# Patient Record
Sex: Female | Born: 1965
Health system: Southern US, Community
[De-identification: ages and names within clinical notes are randomized; demographics above are authoritative.]

## PROBLEM LIST (undated history)

## (undated) DIAGNOSIS — E785 Hyperlipidemia, unspecified: Secondary | ICD-10-CM

## (undated) DIAGNOSIS — K219 Gastro-esophageal reflux disease without esophagitis: Secondary | ICD-10-CM

## (undated) DIAGNOSIS — M1990 Unspecified osteoarthritis, unspecified site: Secondary | ICD-10-CM

## (undated) DIAGNOSIS — R51 Headache: Secondary | ICD-10-CM

## (undated) DIAGNOSIS — E559 Vitamin D deficiency, unspecified: Secondary | ICD-10-CM

## (undated) HISTORY — DX: Hyperlipidemia, unspecified: E78.5

## (undated) HISTORY — DX: Vitamin D deficiency, unspecified: E55.9

## (undated) HISTORY — DX: Unspecified osteoarthritis, unspecified site: M19.90

## (undated) HISTORY — PX: TONSILLECTOMY: SUR1361

---

## 1997-05-30 ENCOUNTER — Emergency Department (HOSPITAL_COMMUNITY): Admission: EM | Admit: 1997-05-30 | Discharge: 1997-05-30 | Payer: Self-pay | Admitting: Emergency Medicine

## 1998-01-05 ENCOUNTER — Emergency Department (HOSPITAL_COMMUNITY): Admission: EM | Admit: 1998-01-05 | Discharge: 1998-01-05 | Payer: Self-pay | Admitting: Emergency Medicine

## 1998-01-05 ENCOUNTER — Encounter: Payer: Self-pay | Admitting: Emergency Medicine

## 1998-07-12 ENCOUNTER — Encounter: Payer: Self-pay | Admitting: Emergency Medicine

## 1998-07-12 ENCOUNTER — Emergency Department (HOSPITAL_COMMUNITY): Admission: EM | Admit: 1998-07-12 | Discharge: 1998-07-12 | Payer: Self-pay | Admitting: Emergency Medicine

## 2000-05-08 ENCOUNTER — Encounter: Payer: Self-pay | Admitting: Internal Medicine

## 2000-05-08 ENCOUNTER — Ambulatory Visit (HOSPITAL_COMMUNITY): Admission: RE | Admit: 2000-05-08 | Discharge: 2000-05-08 | Payer: Self-pay | Admitting: Internal Medicine

## 2002-08-19 ENCOUNTER — Other Ambulatory Visit: Admission: RE | Admit: 2002-08-19 | Discharge: 2002-08-19 | Payer: Self-pay | Admitting: Obstetrics and Gynecology

## 2003-10-17 ENCOUNTER — Other Ambulatory Visit: Admission: RE | Admit: 2003-10-17 | Discharge: 2003-10-17 | Payer: Self-pay | Admitting: Obstetrics and Gynecology

## 2004-03-31 ENCOUNTER — Inpatient Hospital Stay (HOSPITAL_COMMUNITY): Admission: AD | Admit: 2004-03-31 | Discharge: 2004-04-02 | Payer: Self-pay | Admitting: Obstetrics and Gynecology

## 2005-04-10 ENCOUNTER — Other Ambulatory Visit: Admission: RE | Admit: 2005-04-10 | Discharge: 2005-04-10 | Payer: Self-pay | Admitting: Obstetrics and Gynecology

## 2006-01-13 ENCOUNTER — Encounter: Admission: RE | Admit: 2006-01-13 | Discharge: 2006-01-13 | Payer: Self-pay | Admitting: Internal Medicine

## 2007-02-24 ENCOUNTER — Encounter: Admission: RE | Admit: 2007-02-24 | Discharge: 2007-02-24 | Payer: Self-pay | Admitting: Obstetrics and Gynecology

## 2010-07-05 NOTE — Discharge Summary (Signed)
NAME:  TATE, Tomeshia                  ACCOUNT NO.:  [id number]   MEDICAL RECORD NO.:  [id number]          PATIENT TYPE:  INP   LOCATION:  9110                          FACILITY:  [facility]   PHYSICIAN:  Malachi Pro. Ambrose Mantle, M.D. DATE OF BIRTH:  Dec 15, 1965   DATE OF ADMISSION:  03/31/2004  DATE OF DISCHARGE:                                 DISCHARGE SUMMARY   HOSPITAL COURSE:  A 45 year old black female para 1-0-1-1 gravida 3; [facility]  April 23, 2004 by ultrasound; admitted with ruptured membranes in active  labor.  Blood group and type B positive, negative antibody, sickle cell  negative, RPR nonreactive, rubella immune, hepatitis B surface antigen  negative, HIV declined, GC and chlamydia negative, triple screen negative, 1-  hour Glucola 117, group B strep negative.  Ultrasound on September 27, 2003:  Gestational age [redacted] weeks 1 day; [facility] April 23, 2004.  Small fibroid was noted.  On November 22, 2003 ultrasound showed a mean gestational age of [redacted] weeks 2  days; [facility] April 22, 2004; small fibroid again noted.  The patient declined  amniocentesis.  On February 23, 2004 ultrasound was ordered for size greater  than dates.  According to the patient, ultrasound showed no evidence of  macrosomia.  At approximately 2:40 a.m. on the day of admission, the patient  had spontaneous rupture of membranes.  She began contracting en route to the  hospital.  On arrival here her cervix was 4 cm, 100%, vertex at a 0 station.  Past medical history:  No known allergies.  Operations:  In 1987, T&A.  Illnesses:  Urinary tract infections.  Family history:  Mother with high  blood pressure and diabetes, maternal grandmother with diabetes, maternal  grandfather with emphysema.  Alcohol, tobacco, and drugs:  The patient  smoked one cigarette per day.  Obstetric history:  In 1985, a spontaneous  abortion.  In 1992, a 5-pound 10-ounce female delivered vaginally.  Physical  exam revealed normal vital signs.  Heart normal size and sounds, no  murmurs.  Lungs clear to auscultation.  Abdomen was soft, near term-size fundus.  Fetal heart tones were normal.  Contractions every 3 minutes.  Cervix 6 cm,  100% by the admitting nurse.  The patient received Stadol and contractions  spaced out.  She made no progress so Pitocin was begun at 0.5 mU per minute  and contractions resumed.  She became fully dilated and it was noted after  being placed in stirrups that she had no anterior rectal sphincter (the  patient states she has occasional fecal incontinence but it is not a problem  for her).  The patient pushed well and the vertex was ROP.  She delivered  spontaneously ROP over an intact perineum by Dr. Ambrose Mantle a living female  infant, 5 pounds 5 ounces, Apgars of 9 at one and 9 at five minutes.  Placenta was intact, uterus normal, but I could not reach the fundus.  Rectal was negative.  No lacerations were present.  Absence of anterior  rectal sphincter was confirmed by rectal exam but there were no rectal  lacerations.  Blood loss was 200 mL.  Postpartum the patient did well and  was discharged on postpartum day #2.   LABORATORY DATA:  Initial hemoglobin 10.9; hematocrit 33.2; white count  7900; platelet count 269,000.  Follow-up hemoglobin 9.7.  RPR nonreactive.   FINAL DIAGNOSES:  1.  Intrauterine pregnancy at 36 weeks 5 days delivered right occiput      posterior.  2.  Premature labor and delivery.  3.  Absence of rectal sphincter anteriorly.   OPERATIONS:  Spontaneous delivery ROP.   FINAL CONDITION:  Improved.   Instructions include our regular discharge instruction booklet, Percocet  5/325 #24 tablets one q.4-6h. as needed for pain, and the patient is advised  to return in 6 weeks for follow-up examination.      TFH/MEDQ  D:  04/02/2004  T:  04/02/2004  Job:  [id number]

## 2010-10-28 ENCOUNTER — Other Ambulatory Visit: Payer: Self-pay | Admitting: Obstetrics and Gynecology

## 2010-10-28 DIAGNOSIS — Z1231 Encounter for screening mammogram for malignant neoplasm of breast: Secondary | ICD-10-CM

## 2010-11-08 ENCOUNTER — Ambulatory Visit
Admission: RE | Admit: 2010-11-08 | Discharge: 2010-11-08 | Disposition: A | Discharge status: Home / Self Care | Payer: 59 | Source: Ambulatory Visit | Attending: Obstetrics and Gynecology | Admitting: Obstetrics and Gynecology

## 2010-11-08 DIAGNOSIS — Z1231 Encounter for screening mammogram for malignant neoplasm of breast: Secondary | ICD-10-CM

## 2010-11-15 ENCOUNTER — Other Ambulatory Visit: Payer: Self-pay | Admitting: Obstetrics and Gynecology

## 2010-11-15 DIAGNOSIS — R928 Other abnormal and inconclusive findings on diagnostic imaging of breast: Secondary | ICD-10-CM

## 2010-11-27 ENCOUNTER — Ambulatory Visit
Admission: RE | Admit: 2010-11-27 | Discharge: 2010-11-27 | Disposition: A | Discharge status: Home / Self Care | Payer: 59 | Source: Ambulatory Visit | Attending: Obstetrics and Gynecology | Admitting: Obstetrics and Gynecology

## 2010-11-27 DIAGNOSIS — R928 Other abnormal and inconclusive findings on diagnostic imaging of breast: Secondary | ICD-10-CM

## 2011-08-13 DIAGNOSIS — L910 Hypertrophic scar: Secondary | ICD-10-CM | POA: Insufficient documentation

## 2011-08-13 DIAGNOSIS — B079 Viral wart, unspecified: Secondary | ICD-10-CM | POA: Insufficient documentation

## 2011-10-24 ENCOUNTER — Emergency Department (INDEPENDENT_AMBULATORY_CARE_PROVIDER_SITE_OTHER)
Admission: EM | Admit: 2011-10-24 | Discharge: 2011-10-24 | Disposition: A | Discharge status: Nursing Home (Includes ICF) | Payer: 59 | Source: Home / Self Care | Attending: Emergency Medicine | Admitting: Emergency Medicine

## 2011-10-24 ENCOUNTER — Encounter (HOSPITAL_COMMUNITY): Payer: Self-pay | Admitting: Emergency Medicine

## 2011-10-24 ENCOUNTER — Encounter (HOSPITAL_COMMUNITY): Payer: Self-pay | Admitting: *Deleted

## 2011-10-24 DIAGNOSIS — Z79899 Other long term (current) drug therapy: Secondary | ICD-10-CM | POA: Insufficient documentation

## 2011-10-24 DIAGNOSIS — F17200 Nicotine dependence, unspecified, uncomplicated: Secondary | ICD-10-CM | POA: Insufficient documentation

## 2011-10-24 DIAGNOSIS — R110 Nausea: Secondary | ICD-10-CM

## 2011-10-24 DIAGNOSIS — R109 Unspecified abdominal pain: Secondary | ICD-10-CM | POA: Insufficient documentation

## 2011-10-24 DIAGNOSIS — R1032 Left lower quadrant pain: Secondary | ICD-10-CM

## 2011-10-24 LAB — URINALYSIS, ROUTINE W REFLEX MICROSCOPIC
Leukocytes, UA: NEGATIVE
Nitrite: NEGATIVE
Specific Gravity, Urine: 1.033 — ABNORMAL HIGH (ref 1.005–1.030)
pH: 5.5 (ref 5.0–8.0)

## 2011-10-24 LAB — CBC
Hemoglobin: 11.4 g/dL — ABNORMAL LOW (ref 12.0–15.0)
MCH: 26.9 pg (ref 26.0–34.0)
MCHC: 32.8 g/dL (ref 30.0–36.0)
RDW: 14.4 % (ref 11.5–15.5)

## 2011-10-24 MED ORDER — ONDANSETRON 4 MG PO TBDP
ORAL_TABLET | ORAL | Status: AC
  Filled 2011-10-24: qty 2

## 2011-10-24 MED ORDER — ONDANSETRON 4 MG PO TBDP
8.0000 mg | ORAL_TABLET | Freq: Once | ORAL | Status: AC
  Administered 2011-10-24: 8 mg via ORAL

## 2011-10-24 NOTE — ED Notes (Signed)
Lab called to draw pts blood

## 2011-10-24 NOTE — ED Provider Notes (Signed)
History     CSN: [id number]  Arrival date & time 10/24/11  1929   First MD Initiated Contact with Patient 10/24/11 2108      Chief Complaint  Patient presents with  . Abdominal Pain    (Consider location/radiation/quality/duration/timing/severity/associated sxs/prior treatment) The history is provided by the patient.   Patient complains of LLQ pain that began 5 days ago,  pain worse after eating. Sunday, reports to have had fever, nausea, vomiting, diarrhea and cramping. Symptoms improved throughout the course of the week but still experiencing LLQ pain with nausea and abdominal cramping.     History reviewed. No pertinent past medical history.  Past Surgical History  Procedure Date  . Tonsillectomy     Family History  Problem Relation Age of Onset  . Diabetes Mother     History  Substance Use Topics  . Smoking status: Current Everyday Smoker    Types: Cigarettes  . Smokeless tobacco: Not on file  . Alcohol Use: No    OB History    Grav Para Term Preterm Abortions TAB SAB Ect Mult Living                  Review of Systems  Constitutional: Positive for chills and fatigue. Negative for fever, diaphoresis, activity change, appetite change and unexpected weight change.  Respiratory: Negative.   Cardiovascular: Negative.   Gastrointestinal: Positive for nausea, vomiting, abdominal pain, diarrhea and abdominal distention. Negative for blood in stool and anal bleeding.  Genitourinary: Negative.   Musculoskeletal: Negative.     Allergies  Review of patient's allergies indicates no known allergies.  Home Medications  No current outpatient prescriptions on file.  BP 125/80  Pulse 76  Temp 97.7 F (36.5 C) (Oral)  Resp 20  SpO2 99%  Physical Exam  Nursing note and vitals reviewed. Constitutional: She is oriented to person, place, and time. Vital signs are normal. She appears well-developed and well-nourished. She is active and cooperative.  HENT:  Head:  Normocephalic.  Eyes: Conjunctivae are normal. Pupils are equal, round, and reactive to light. No scleral icterus.  Neck: Trachea normal. Neck supple.  Cardiovascular: Normal rate, regular rhythm, normal heart sounds, intact distal pulses and normal pulses.   Pulmonary/Chest: Effort normal and breath sounds normal.  Abdominal: Soft. Normal appearance. She exhibits distension. She exhibits no shifting dullness, no abdominal bruit, no ascites and no mass. Bowel sounds are decreased. There is no hepatosplenomegaly. There is tenderness in the left lower quadrant. There is guarding.  Neurological: She is alert and oriented to person, place, and time. No cranial nerve deficit or sensory deficit.  Skin: Skin is warm and dry.  Psychiatric: She has a normal mood and affect. Her speech is normal and behavior is normal. Judgment and thought content normal. Cognition and memory are normal.    ED Course  Procedures (including critical care time)  Labs Reviewed - No data to display No results found.   1. LLQ abdominal pain   2. Nausea       MDM  Zofran 8mg  ODT prior to departure.  Transfer to [facility] for further evaluation and treatment of LLQ pain and nausea.        Johnsie Kindred, NP 10/24/11 2205

## 2011-10-24 NOTE — ED Notes (Signed)
5 days of LLQ pain that is constant and dull,with episodes of cramping at times.  She also had N and V with fever  4 days ago.   Her last BM was small and hard with a streak of blood noted by her.  Her appetite has been decreased

## 2011-10-24 NOTE — ED Notes (Signed)
C/o LLQ pain and nausea since Sunday.  States she did have vomiting, diarrhea, and fever that have resolved.  Pt sent from [facility].

## 2011-10-24 NOTE — ED Provider Notes (Signed)
Medical screening examination/treatment/procedure(s) were performed by non-physician practitioner and as supervising physician I was immediately available for consultation/collaboration. I personally discussed this case with Porfirio Mylar. This patient does have a risk for diverticulitis, and therefore I concur with her decision to transfer to the emergency department for further evaluation and treatment.  Leslee Home, M.D.   Reuben Likes, MD 10/24/11 [phone]

## 2011-10-25 ENCOUNTER — Emergency Department (HOSPITAL_COMMUNITY): Payer: 59

## 2011-10-25 ENCOUNTER — Emergency Department (HOSPITAL_COMMUNITY)
Admission: EM | Admit: 2011-10-25 | Discharge: 2011-10-25 | Disposition: A | Discharge status: Home / Self Care | Payer: 59 | Attending: Emergency Medicine | Admitting: Emergency Medicine

## 2011-10-25 DIAGNOSIS — R109 Unspecified abdominal pain: Secondary | ICD-10-CM

## 2011-10-25 LAB — COMPREHENSIVE METABOLIC PANEL
ALT: 13 U/L (ref 0–35)
Albumin: 3.5 g/dL (ref 3.5–5.2)
BUN: 9 mg/dL (ref 6–23)
Calcium: 9.4 mg/dL (ref 8.4–10.5)
GFR calc Af Amer: >90 mL/min (ref >90)
Glucose, Bld: 128 mg/dL — ABNORMAL HIGH (ref 70–99)
Sodium: 140 mEq/L (ref 135–145)
Total Protein: 7.2 g/dL (ref 6.0–8.3)

## 2011-10-25 MED ORDER — SODIUM CHLORIDE 0.9 % IV SOLN
INTRAVENOUS | Status: DC
  Administered 2011-10-25: 02:00:00 via INTRAVENOUS

## 2011-10-25 MED ORDER — ONDANSETRON HCL 4 MG PO TABS: 4.0000 mg | ORAL_TABLET | Freq: Three times a day (TID) | ORAL | Status: AC | PRN

## 2011-10-25 MED ORDER — HYDROCODONE-ACETAMINOPHEN 5-325 MG PO TABS: 1.0000 | ORAL_TABLET | ORAL | Status: AC | PRN

## 2011-10-25 MED ORDER — ONDANSETRON HCL 4 MG/2ML IJ SOLN
4.0000 mg | Freq: Once | INTRAMUSCULAR | Status: AC
  Administered 2011-10-25: 4 mg via INTRAVENOUS
  Filled 2011-10-25: qty 2

## 2011-10-25 MED ORDER — IOHEXOL 300 MG/ML  SOLN
100.0000 mL | Freq: Once | INTRAMUSCULAR | Status: AC | PRN
  Administered 2011-10-25: 100 mL via INTRAVENOUS

## 2011-10-25 MED ORDER — IOHEXOL 300 MG/ML  SOLN
20.0000 mL | INTRAMUSCULAR | Status: AC
  Administered 2011-10-25: 20 mL via ORAL

## 2011-10-25 NOTE — ED Provider Notes (Signed)
History     CSN: [id number]  Arrival date & time 10/24/11  2219   First MD Initiated Contact with Patient 10/25/11 0150      Chief Complaint  Patient presents with  . Abdominal Pain    (Consider location/radiation/quality/duration/timing/severity/associated sxs/prior treatment) HPI Comments: Jacqueline Baxter is a 46 y.o. Female   who has had left lower quadrant abdominal pain for 5 days. She initially had diarrhea, but has had none in the last 2 days. Since then, her stools are smaller caliber than usual, and she occasionally sees blood mixed in. She has ongoing, nausea, without vomiting. She was able to eat lunch today. She's never had this previously. There are no known aggravating or palliative factors. She has a pressure sensation when voiding, which is uncomfortable. She denies dysuria, or urinary frequency. She has no vaginal discharge, or bleeding; she does not know when her last menstrual period was.   Patient is a 46 y.o. female presenting with abdominal pain. The history is provided by the patient.  Abdominal Pain The primary symptoms of the illness include abdominal pain.    History reviewed. No pertinent past medical history.  Past Surgical History  Procedure Date  . Tonsillectomy     Family History  Problem Relation Age of Onset  . Diabetes Mother     History  Substance Use Topics  . Smoking status: Current Everyday Smoker    Types: Cigarettes  . Smokeless tobacco: Not on file  . Alcohol Use: No    OB History    Grav Para Term Preterm Abortions TAB SAB Ect Mult Living                  Review of Systems  Gastrointestinal: Positive for abdominal pain.  All other systems reviewed and are negative.    Allergies  Penicillins  Home Medications   Current Outpatient Rx  Name Route Sig Dispense Refill  . ADULT MULTIVITAMIN W/MINERALS CH Oral Take 1 tablet by mouth daily.    Marland Kitchen RASPBERRY KETONES PO Oral Take 1 tablet by mouth every morning.    Marland Kitchen VITAMIN C  500 MG PO TABS Oral Take 500 mg by mouth daily.    Marland Kitchen HYDROCODONE-ACETAMINOPHEN 5-325 MG PO TABS Oral Take 1 tablet by mouth every 4 (four) hours as needed for pain. 20 tablet 0  . ONDANSETRON HCL 4 MG PO TABS Oral Take 1 tablet (4 mg total) by mouth every 8 (eight) hours as needed for nausea. 12 tablet 0    BP 107/77  Pulse 64  Temp 96.8 F (36 C) (Oral)  Resp 14  SpO2 98%  Physical Exam  Nursing note and vitals reviewed. Constitutional: She is oriented to person, place, and time. She appears well-developed and well-nourished. No distress.  HENT:  Head: Normocephalic and atraumatic.  Eyes: Conjunctivae and EOM are normal. Pupils are equal, round, and reactive to light.  Neck: Normal range of motion and phonation normal. Neck supple.  Cardiovascular: Normal rate, regular rhythm and intact distal pulses.   Pulmonary/Chest: Effort normal and breath sounds normal. She exhibits no tenderness.  Abdominal: Soft. She exhibits no distension and no mass. There is tenderness (Mild left lower quadrant). There is no rebound and no guarding.        No suprapubic tenderness  Musculoskeletal: Normal range of motion.  Neurological: She is alert and oriented to person, place, and time. She has normal strength. She exhibits normal muscle tone.  Skin: Skin is warm and dry.  Psychiatric: She has a normal mood and affect. Her behavior is normal. Judgment and thought content normal.    ED Course  Procedures (including critical care time)  Emergency department treatment: IV fluids, and IV Zofran. Reevaluation: 07:20- patient is comfortable now. No vomiting. After oral contrast Repeat vital signs are reassuring.  Case discussed with on-call GI. He recommends, stool cultures, stool, when necessary followup with him for reevaluation next week     Labs Reviewed  CBC - Abnormal; Notable for the following:    Hemoglobin 11.4 (*)     HCT 34.8 (*)     All other components within normal limits    COMPREHENSIVE METABOLIC PANEL - Abnormal; Notable for the following:    Glucose, Bld 128 (*)     Total Bilirubin 0.1 (*)     All other components within normal limits  URINALYSIS, ROUTINE W REFLEX MICROSCOPIC - Abnormal; Notable for the following:    APPearance CLOUDY (*)     Specific Gravity, Urine 1.033 (*)     All other components within normal limits  POCT PREGNANCY, URINE  GI PATHOGEN PANEL BY PCR, STOOL  STOOL CULTURE   Ct Abdomen Pelvis W Contrast  10/25/2011  **ADDENDUM** CREATED: 10/25/2011 06:53:15  Examination results discussed with Dr. Effie Shy at 0981 hours on 10/25/2011.  Appendix is not specifically identified.  **END ADDENDUM** SIGNED BY: Marlon Pel, M.D.   10/25/2011  *RADIOLOGY REPORT*  Clinical Data: Left lower quadrant pain worse after eating.  Fever. Nausea.  Vomiting.  Diarrhea.  Cramping.  CT ABDOMEN AND PELVIS WITH CONTRAST  Technique:  Multidetector CT imaging of the abdomen and pelvis was performed following the standard protocol during bolus administration of intravenous contrast.  Contrast: OMNIPAQUE IOHEXOL 300 MG/ML  SOLN  Comparison: None.  Findings: Mild dependent atelectasis in the lung bases.  Diffuse low attenuation change in the liver consistent with fatty infiltration.  The spleen, gallbladder, pancreas, adrenal glands, kidneys, abdominal aorta, and retroperitoneal lymph nodes are unremarkable.  No free fluid or free air in the abdomen.  The stomach and small bowel are decompressed.  The colon is diffusely stool filled.  The colon is incompletely distended but there is a suggestion of thickening of the wall of the right colon and transverse colon with some thickening also demonstrated in the terminal ileum.  There is mild inflammatory stranding in the right lower quadrant and around the sigmoid colon.  Changes suggest colitis.  This could be due to infectious colitis or inflammatory bowel disease such as Crohn's. Coexisting diverticulitis not excluded.  The  portal and mesenteric vessels appear patent.  Pelvis:  Bladder wall is not thickened.  Intrauterine device in place.  Uterus and adnexal structures are not enlarged. Diverticulosis of sigmoid colon.  No free or loculated pelvic fluid collections.  No significant pelvic lymphadenopathy.  Normal alignment of the lumbar vertebrae.  IMPRESSION: Wall thickening involving the terminal ileum and right colon with inflammatory stranding in the right lower quadrant and around the sigmoid colon.  Changes suggest either or a combination of infectious/inflammatory colitis and / or diverticulitis.   Original Report Authenticated By: Marlon Pel, M.D.      1. Abdominal  pain, other specified site       MDM  Nonspecific pain, with stooling, abnormality, likely, a GI process. Clinically doubt pelvic infection or disorder. Evaluation with CT scan for possibilities in including colitis.     Plan: Home Medications- Norco, Zofran; Home Treatments- low fiber diet; Recommended  follow up- GI in 4 days    Flint Melter, MD 10/25/11 [phone]

## 2011-10-25 NOTE — ED Notes (Signed)
Pt unable to give stool sample at the time. Spoke with lab, pt to give outpatient stool sample and return to main lab for testing. Instructions explained to patient, no further questions at the time.

## 2011-10-29 ENCOUNTER — Encounter: Payer: Self-pay | Admitting: Gastroenterology

## 2011-11-11 ENCOUNTER — Other Ambulatory Visit: Payer: Self-pay | Admitting: Gastroenterology

## 2011-11-11 DIAGNOSIS — R109 Unspecified abdominal pain: Secondary | ICD-10-CM

## 2011-11-11 DIAGNOSIS — R112 Nausea with vomiting, unspecified: Secondary | ICD-10-CM

## 2011-11-26 ENCOUNTER — Ambulatory Visit: Payer: 59 | Admitting: Gastroenterology

## 2011-11-26 ENCOUNTER — Telehealth: Payer: Self-pay | Admitting: Gastroenterology

## 2011-11-26 NOTE — Telephone Encounter (Signed)
Message copied by Leanor Kail I on Wed Nov 26, 2011 11:28 AM ------      Message from: Donata Duff      Created: Wed Nov 26, 2011 11:24 AM       Do not bill

## 2011-11-27 ENCOUNTER — Encounter (HOSPITAL_COMMUNITY)
Admission: RE | Admit: 2011-11-27 | Discharge: 2011-11-27 | Disposition: A | Discharge status: Home / Self Care | Payer: 59 | Source: Ambulatory Visit | Attending: Gastroenterology | Admitting: Gastroenterology

## 2011-11-27 DIAGNOSIS — K8020 Calculus of gallbladder without cholecystitis without obstruction: Secondary | ICD-10-CM | POA: Insufficient documentation

## 2011-11-27 DIAGNOSIS — R109 Unspecified abdominal pain: Secondary | ICD-10-CM

## 2011-11-27 DIAGNOSIS — R112 Nausea with vomiting, unspecified: Secondary | ICD-10-CM | POA: Insufficient documentation

## 2012-04-26 LAB — HM COLONOSCOPY

## 2012-04-30 ENCOUNTER — Emergency Department (HOSPITAL_COMMUNITY)
Admission: EM | Admit: 2012-04-30 | Discharge: 2012-04-30 | Disposition: A | Discharge status: Home / Self Care | Payer: 59 | Source: Home / Self Care

## 2012-04-30 ENCOUNTER — Encounter (HOSPITAL_COMMUNITY): Payer: Self-pay | Admitting: Emergency Medicine

## 2012-04-30 ENCOUNTER — Emergency Department (INDEPENDENT_AMBULATORY_CARE_PROVIDER_SITE_OTHER): Payer: 59

## 2012-04-30 DIAGNOSIS — S63509A Unspecified sprain of unspecified wrist, initial encounter: Secondary | ICD-10-CM

## 2012-04-30 DIAGNOSIS — S63502A Unspecified sprain of left wrist, initial encounter: Secondary | ICD-10-CM

## 2012-04-30 HISTORY — DX: Gastro-esophageal reflux disease without esophagitis: K21.9

## 2012-04-30 MED ORDER — NAPROXEN 375 MG PO TABS: 375.0000 mg | ORAL_TABLET | Freq: Two times a day (BID) | ORAL | Status: DC

## 2012-04-30 NOTE — ED Provider Notes (Signed)
History     CSN: [id number]  Arrival date & time 04/30/12  1425   First MD Initiated Contact with Patient 04/30/12 1559      Chief Complaint  Patient presents with  . Wrist Pain    (Consider location/radiation/quality/duration/timing/severity/associated sxs/prior treatment) HPI 47 y.o. female with left wrist injury.  Hit Left wrist on door yesterday AM - caught arm in door as closed. Hurt but did not notice any major problems at time. But pain has been increasing since. Today hurt all day and hard to sleep last night. Getting worse. Burning pain on ulnar side of wrist, palmar surface. Aches/throbs when holds hand down or supinates.  Feels like fingers are swollen today. Most movements of wrist and fingers cause pain - making fist, flexing wrist, flexing and extending fingers. Has tried ice and tylenol. Helps some. Wrapped with ACE bandage but made it hurt more. Hurts to touch it. Sensation intact in fingers. Works at [employer] - not able to tpe all day.   Past Medical History  Diagnosis Date  . Acid reflux     Past Surgical History  Procedure Laterality Date  . Tonsillectomy      Family History  Problem Relation Age of Onset  . Diabetes Mother     History  Substance Use Topics  . Smoking status: Current Every Day Smoker    Types: Cigarettes  . Smokeless tobacco: Not on file  . Alcohol Use: No    OB History   Grav Para Term Preterm Abortions TAB SAB Ect Mult Living                  Review of Systems  Constitutional: Negative for fever and chills.  Neurological: Negative for weakness and numbness.    Allergies  Penicillins  Home Medications   Current Outpatient Rx  Name  Route  Sig  Dispense  Refill  . Omeprazole-Sodium Bicarbonate (ZEGERID PO)   Oral   Take by mouth.         . Multiple Vitamin (MULTIVITAMIN WITH MINERALS) TABS   Oral   Take 1 tablet by mouth daily.         Marland Kitchen RASPBERRY KETONES PO   Oral   Take 1 tablet by mouth every morning.          . vitamin C (ASCORBIC ACID) 500 MG tablet   Oral   Take 500 mg by mouth daily.           BP 149/80  Pulse 65  Temp(Src) 98.3 F (36.8 C) (Oral)  Resp 18  SpO2 100%  LMP 04/01/2012  Physical Exam  Constitutional: She appears well-developed and well-nourished. No distress.  HENT:  Head: Normocephalic and atraumatic.  Eyes: Conjunctivae and EOM are normal.  Cardiovascular: Normal rate.   Pulmonary/Chest: Effort normal. No respiratory distress.  Musculoskeletal:  L wrist - no visible bruising, mild swelling ulnar/palmar aspect > radial/dorsal. Tenderness at junction of carpal area and distal radius/ulna on palmar aspect. Able to move all fingers with pain. Pain with passive flexion of 4th and 5th digits especially and passive extension of thumb. No pain or swelling or elbow. Some tenderness with palpation over flexor tendons, esp midline and with flexion of wrist and extension of wrist. Radial and ulnar pulse intact.   Skin: Skin is warm and dry.  Psychiatric: She has a normal mood and affect.    ED Course  Procedures (including critical care time)  *RADIOLOGY REPORT*  Clinical Data: History of injury.  Pain in the medial aspect.  LEFT WRIST - COMPLETE 3+ VIEW  Comparison: None.  Findings: Alignment is normal. Joint spaces are preserved. No  fracture or dislocation is evident. No soft tissue lesions are  seen. Scaphoid appears intact.  IMPRESSION:  No wrist abnormality identified.  I personally reviewed images and report. No fractures noted.  Labs Reviewed - No data to display No results found.   1. Sprain of left wrist, initial encounter    Wrist splint provided Rest, ice, compression, elevation Naproxen. Follow up with PCP as needed.  Napoleon Form, MD       Napoleon Form, MD 04/30/12 2227

## 2012-04-30 NOTE — ED Notes (Signed)
Pt c/o wrist pain on right hand since yest Reports hitting a door as she was leaving; did not pay much attention thinking it would resolve on its own.  Pain graudually getting worse w/limitted ROM Sx include: swelling, pain that increases w/activity Took tyle around 0730 for the discomfort  She is alert and oriented w/no signs of acute distress

## 2012-06-29 ENCOUNTER — Ambulatory Visit (INDEPENDENT_AMBULATORY_CARE_PROVIDER_SITE_OTHER): Payer: Self-pay | Admitting: Surgery

## 2012-07-13 ENCOUNTER — Encounter (INDEPENDENT_AMBULATORY_CARE_PROVIDER_SITE_OTHER): Payer: Self-pay | Admitting: Surgery

## 2012-07-20 ENCOUNTER — Ambulatory Visit (INDEPENDENT_AMBULATORY_CARE_PROVIDER_SITE_OTHER): Payer: 59 | Admitting: Surgery

## 2012-07-20 ENCOUNTER — Encounter (INDEPENDENT_AMBULATORY_CARE_PROVIDER_SITE_OTHER): Payer: Self-pay | Admitting: Surgery

## 2012-07-20 VITALS — BP 130/78 | HR 86 | Temp 97.0°F | Resp 14 | Ht 61.0 in | Wt 177.0 lb

## 2012-07-20 DIAGNOSIS — K8020 Calculus of gallbladder without cholecystitis without obstruction: Secondary | ICD-10-CM

## 2012-07-20 HISTORY — DX: Calculus of gallbladder without cholecystitis without obstruction: K80.20

## 2012-07-20 NOTE — Progress Notes (Signed)
Patient ID: Jacqueline Baxter, female   DOB: 03/07/1965, 47 y.o.   MRN: [id number]  Chief Complaint  Patient presents with  . Other    gallstones    HPI Jacqueline Baxter is a 47 y.o. female.   HPI This is a very pleasant female referred to me by Dr. Loreta Ave for evaluation of symptomatic cholelithiasis. The patient has known gallstones diagnosed on ultrasound last year after an attack of right upper quadrant abdominal pain, nausea, and vomiting. She originally controlled her symptoms with diet modification but they are now increasing. She will have attacks of right upper quadrant and epigastric abdominal pain with nausea and vomiting after fatty meals. She is currently pain-free today. The pain is moderate and sharp when the attacks occur. She is otherwise without complaints other than mild loose bowel movements. Past Medical History  Diagnosis Date  . Acid reflux   . Vitamin D deficiency disease   . Hyperlipidemia   . Arthritis     Past Surgical History  Procedure Laterality Date  . Tonsillectomy      Family History  Problem Relation Age of Onset  . Diabetes Mother     Social History History  Substance Use Topics  . Smoking status: Current Every Day Smoker -- 0.25 packs/day    Types: Cigarettes  . Smokeless tobacco: Never Used  . Alcohol Use: Yes     Comment: rare    Allergies  Allergen Reactions  . Penicillins Other (See Comments)    Unknown childhood reaction    Current Outpatient Prescriptions  Medication Sig Dispense Refill  . butalbital-acetaminophen-caffeine (FIORICET, ESGIC) 50-325-40 MG per tablet       . Multiple Vitamin (MULTIVITAMIN WITH MINERALS) TABS Take 1 tablet by mouth daily.      Maxwell Caul Bicarbonate (ZEGERID PO) Take by mouth.      . Probiotic Product (PROBIOTIC PEARLS PO) Take by mouth.      . vitamin C (ASCORBIC ACID) 500 MG tablet Take 500 mg by mouth daily.      . naproxen (NAPROSYN) 375 MG tablet Take 1 tablet (375 mg total) by mouth 2  (two) times daily.  30 tablet  0  . RASPBERRY KETONES PO Take 1 tablet by mouth every morning.       No current facility-administered medications for this visit.    Review of Systems Review of Systems  Constitutional: Negative for fever, chills and unexpected weight change.  HENT: Negative for hearing loss, congestion, sore throat, trouble swallowing and voice change.   Eyes: Negative for visual disturbance.  Respiratory: Negative for cough and wheezing.   Cardiovascular: Negative for chest pain, palpitations and leg swelling.  Gastrointestinal: Positive for nausea, vomiting and abdominal pain. Negative for diarrhea, constipation, blood in stool, abdominal distention and anal bleeding.  Genitourinary: Negative for hematuria, vaginal bleeding and difficulty urinating.  Musculoskeletal: Positive for arthralgias.  Skin: Negative for rash and wound.  Neurological: Positive for headaches. Negative for seizures and syncope.  Hematological: Negative for adenopathy. Does not bruise/bleed easily.  Psychiatric/Behavioral: Negative for confusion.    Blood pressure 130/78, pulse 86, temperature 97 F (36.1 C), temperature source Temporal, resp. rate 14, height 5\' 1"  (1.549 m), weight 177 lb (80.287 kg), SpO2 98.00%.  Physical Exam Physical Exam  Constitutional: She is oriented to person, place, and time. She appears well-developed and well-nourished. No distress.  HENT:  Head: Normocephalic and atraumatic.  Right Ear: External ear normal.  Left Ear: External ear normal.  Nose: Nose  normal.  Mouth/Throat: Oropharynx is clear and moist. No oropharyngeal exudate.  Eyes: Conjunctivae are normal. Pupils are equal, round, and reactive to light. Right eye exhibits no discharge. Left eye exhibits no discharge. No scleral icterus.  Neck: Normal range of motion. Neck supple. No tracheal deviation present.  Cardiovascular: Normal rate, regular rhythm, normal heart sounds and intact distal pulses.   No  murmur heard. Pulmonary/Chest: Effort normal and breath sounds normal. No respiratory distress. She has no wheezes.  Abdominal: Soft. Bowel sounds are normal. She exhibits no distension. There is tenderness. There is no rebound.  There is very mild tenderness with minimal guarding in the right upper quadrant  Musculoskeletal: Normal range of motion. She exhibits no edema and no tenderness.  Neurological: She is alert and oriented to person, place, and time.  Skin: Skin is warm and dry. No rash noted. No erythema.  Psychiatric: Her behavior is normal. Judgment normal.    Data Reviewed I have been answered Dr. Kenna Gilbert office. I also have the ultrasound showing a 2.6 cm gallstone. I have the reports from the endoscopy as well  Assessment    Symptomatic cholelithiasis     Plan    Laparoscopic cholecystectomy with possible cholangiogram to strongly recommended given her symptoms. I discussed the surgical procedure in detail and gave her literature regarding the surgery. I discussed the risks which includes but is not limited to bleeding, infection, obdurate injury, bile leak, injury to other structures, postoperative recovery, need to convert to an open procedure, et Karie Soda. She understands and wishes to proceed. Surgery will be scheduled. Likelihood of success is good        Remiel Corti A 07/20/2012, 10:26 AM

## 2012-08-05 ENCOUNTER — Encounter (HOSPITAL_COMMUNITY): Payer: Self-pay | Admitting: Respiratory Therapy

## 2012-08-06 ENCOUNTER — Encounter (HOSPITAL_COMMUNITY): Payer: Self-pay

## 2012-08-06 ENCOUNTER — Encounter (HOSPITAL_COMMUNITY)
Admission: RE | Admit: 2012-08-06 | Discharge: 2012-08-06 | Disposition: A | Discharge status: Home / Self Care | Payer: 59 | Source: Ambulatory Visit | Attending: Surgery | Admitting: Surgery

## 2012-08-06 HISTORY — DX: Headache: R51

## 2012-08-06 LAB — COMPREHENSIVE METABOLIC PANEL
ALT: 12 U/L (ref 0–35)
AST: 14 U/L (ref 0–37)
Albumin: 3.6 g/dL (ref 3.5–5.2)
Alkaline Phosphatase: 50 U/L (ref 39–117)
CO2: 29 mEq/L (ref 19–32)
Chloride: 105 mEq/L (ref 96–112)
Creatinine, Ser: 0.78 mg/dL (ref 0.50–1.10)
Potassium: 4.1 mEq/L (ref 3.5–5.1)
Sodium: 141 mEq/L (ref 135–145)
Total Bilirubin: 0.3 mg/dL (ref 0.3–1.2)

## 2012-08-06 LAB — HCG, SERUM, QUALITATIVE: Preg, Serum: NEGATIVE

## 2012-08-06 LAB — CBC
MCH: 26.9 pg (ref 26.0–34.0)
MCHC: 32.5 g/dL (ref 30.0–36.0)
MCV: 82.8 fL (ref 78.0–100.0)
Platelets: 280 10*3/uL (ref 150–400)
RBC: 4.72 MIL/uL (ref 3.87–5.11)

## 2012-08-06 LAB — SURGICAL PCR SCREEN: Staphylococcus aureus: NEGATIVE

## 2012-08-06 NOTE — Pre-Procedure Instructions (Addendum)
Jacqueline Baxter  08/06/2012   Your procedure is scheduled on 08/12/12  Report to [facility] at100 PM.  Call this number if you have problems the morning of surgery: [phone]   Remember:   Do not eat food or drink liquids after midnight.   Take these medicines the morning of surgery with A SIP OF WATER: omeprazole   Do not wear jewelry, make-up or nail polish.  Do not wear lotions, powders, or perfumes. You may wear deodorant.  Do not shave 48 hours prior to surgery. Men may shave face and neck.  Do not bring valuables to the hospital.  [facility] is not responsible                   for any belongings or valuables.  Contacts, dentures or bridgework may not be worn into surgery.  Leave suitcase in the car. After surgery it may be brought to your room.  For patients admitted to the hospital, checkout time is 11:00 AM the day of  discharge.   Patients discharged the day of surgery will not be allowed to drive  home.  Name and phone number of your driver:  Special Instructions: Shower using CHG 2 nights before surgery and the night before surgery.  If you shower the day of surgery use CHG.  Use special wash - you have one bottle of CHG for all showers.  You should use approximately 1/3 of the bottle for each shower.   Please read over the following fact sheets that you were given: Pain Booklet, Coughing and Deep Breathing, MRSA Information and Surgical Site Infection Prevention

## 2012-08-09 ENCOUNTER — Encounter (INDEPENDENT_AMBULATORY_CARE_PROVIDER_SITE_OTHER): Payer: Self-pay

## 2012-08-11 MED ORDER — CIPROFLOXACIN IN D5W 400 MG/200ML IV SOLN
400.0000 mg | INTRAVENOUS | Status: AC
  Administered 2012-08-12: 400 mg via INTRAVENOUS
  Filled 2012-08-11: qty 200

## 2012-08-11 NOTE — H&P (Signed)
Patient ID: Jacqueline Baxter, female DOB: 03-06-1965, 47 y.o. MRN: [id number]  Chief Complaint   Patient presents with   .  Other     gallstones   HPI  Jacqueline Baxter is a 47 y.o. female.  HPI  This is a very pleasant female referred to me by Dr. Loreta Ave for evaluation of symptomatic cholelithiasis. The patient has known gallstones diagnosed on ultrasound last year after an attack of right upper quadrant abdominal pain, nausea, and vomiting. She originally controlled her symptoms with diet modification but they are now increasing. She will have attacks of right upper quadrant and epigastric abdominal pain with nausea and vomiting after fatty meals. She is currently pain-free today. The pain is moderate and sharp when the attacks occur. She is otherwise without complaints other than mild loose bowel movements.  Past Medical History   Diagnosis  Date   .  Acid reflux    .  Vitamin D deficiency disease    .  Hyperlipidemia    .  Arthritis     Past Surgical History   Procedure  Laterality  Date   .  Tonsillectomy      Family History   Problem  Relation  Age of Onset   .  Diabetes  Mother    Social History  History   Substance Use Topics   .  Smoking status:  Current Every Day Smoker -- 0.25 packs/day     Types:  Cigarettes   .  Smokeless tobacco:  Never Used   .  Alcohol Use:  Yes      Comment: rare    Allergies   Allergen  Reactions   .  Penicillins  Other (See Comments)     Unknown childhood reaction    Current Outpatient Prescriptions   Medication  Sig  Dispense  Refill   .  butalbital-acetaminophen-caffeine (FIORICET, ESGIC) 50-325-40 MG per tablet      .  Multiple Vitamin (MULTIVITAMIN WITH MINERALS) TABS  Take 1 tablet by mouth daily.     Maxwell Caul Bicarbonate (ZEGERID PO)  Take by mouth.     .  Probiotic Product (PROBIOTIC PEARLS PO)  Take by mouth.     .  vitamin C (ASCORBIC ACID) 500 MG tablet  Take 500 mg by mouth daily.     .  naproxen (NAPROSYN) 375 MG tablet   Take 1 tablet (375 mg total) by mouth 2 (two) times daily.  30 tablet  0   .  RASPBERRY KETONES PO  Take 1 tablet by mouth every morning.      No current facility-administered medications for this visit.   Review of Systems  Review of Systems  Constitutional: Negative for fever, chills and unexpected weight change.  HENT: Negative for hearing loss, congestion, sore throat, trouble swallowing and voice change.  Eyes: Negative for visual disturbance.  Respiratory: Negative for cough and wheezing.  Cardiovascular: Negative for chest pain, palpitations and leg swelling.  Gastrointestinal: Positive for nausea, vomiting and abdominal pain. Negative for diarrhea, constipation, blood in stool, abdominal distention and anal bleeding.  Genitourinary: Negative for hematuria, vaginal bleeding and difficulty urinating.  Musculoskeletal: Positive for arthralgias.  Skin: Negative for rash and wound.  Neurological: Positive for headaches. Negative for seizures and syncope.  Hematological: Negative for adenopathy. Does not bruise/bleed easily.  Psychiatric/Behavioral: Negative for confusion.  Blood pressure 130/78, pulse 86, temperature 97 F (36.1 C), temperature source Temporal, resp. rate 14, height 5\' 1"  (1.549 m), weight  177 lb (80.287 kg), SpO2 98.00%.  Physical Exam  Physical Exam  Constitutional: She is oriented to person, place, and time. She appears well-developed and well-nourished. No distress.  HENT:  Head: Normocephalic and atraumatic.  Right Ear: External ear normal.  Left Ear: External ear normal.  Nose: Nose normal.  Mouth/Throat: Oropharynx is clear and moist. No oropharyngeal exudate.  Eyes: Conjunctivae are normal. Pupils are equal, round, and reactive to light. Right eye exhibits no discharge. Left eye exhibits no discharge. No scleral icterus.  Neck: Normal range of motion. Neck supple. No tracheal deviation present.  Cardiovascular: Normal rate, regular rhythm, normal heart  sounds and intact distal pulses.  No murmur heard.  Pulmonary/Chest: Effort normal and breath sounds normal. No respiratory distress. She has no wheezes.  Abdominal: Soft. Bowel sounds are normal. She exhibits no distension. There is tenderness. There is no rebound.  There is very mild tenderness with minimal guarding in the right upper quadrant  Musculoskeletal: Normal range of motion. She exhibits no edema and no tenderness.  Neurological: She is alert and oriented to person, place, and time.  Skin: Skin is warm and dry. No rash noted. No erythema.  Psychiatric: Her behavior is normal. Judgment normal.  Data Reviewed  I have reviewed Dr. Kenna Gilbert office notes. I also have the ultrasound showing a 2.6 cm gallstone. I have the reports from the endoscopy as well  Assessment  Symptomatic cholelithiasis  Plan  Laparoscopic cholecystectomy with possible cholangiogram to strongly recommended given her symptoms. I discussed the surgical procedure in detail and gave her literature regarding the surgery. I discussed the risks which includes but is not limited to bleeding, infection, obdurate injury, bile leak, injury to other structures, postoperative recovery, need to convert to an open procedure, et Karie Soda. She understands and wishes to proceed. Surgery will be scheduled. Likelihood of success is good

## 2012-08-12 ENCOUNTER — Encounter (HOSPITAL_COMMUNITY): Payer: Self-pay | Admitting: Anesthesiology

## 2012-08-12 ENCOUNTER — Encounter (HOSPITAL_COMMUNITY)
Admission: RE | Disposition: A | Discharge status: Home / Self Care | Payer: Self-pay | Source: Ambulatory Visit | Attending: Surgery

## 2012-08-12 ENCOUNTER — Ambulatory Visit (HOSPITAL_COMMUNITY): Payer: 59 | Admitting: Anesthesiology

## 2012-08-12 ENCOUNTER — Encounter (HOSPITAL_COMMUNITY): Payer: Self-pay | Admitting: *Deleted

## 2012-08-12 ENCOUNTER — Ambulatory Visit (HOSPITAL_COMMUNITY)
Admission: RE | Admit: 2012-08-12 | Discharge: 2012-08-12 | Disposition: A | Discharge status: Home / Self Care | Payer: 59 | Source: Ambulatory Visit | Attending: Surgery | Admitting: Surgery

## 2012-08-12 DIAGNOSIS — M129 Arthropathy, unspecified: Secondary | ICD-10-CM | POA: Insufficient documentation

## 2012-08-12 DIAGNOSIS — F17200 Nicotine dependence, unspecified, uncomplicated: Secondary | ICD-10-CM | POA: Insufficient documentation

## 2012-08-12 DIAGNOSIS — K8010 Calculus of gallbladder with chronic cholecystitis without obstruction: Secondary | ICD-10-CM | POA: Insufficient documentation

## 2012-08-12 DIAGNOSIS — E785 Hyperlipidemia, unspecified: Secondary | ICD-10-CM | POA: Insufficient documentation

## 2012-08-12 DIAGNOSIS — Z880 Allergy status to penicillin: Secondary | ICD-10-CM | POA: Insufficient documentation

## 2012-08-12 DIAGNOSIS — E559 Vitamin D deficiency, unspecified: Secondary | ICD-10-CM | POA: Insufficient documentation

## 2012-08-12 DIAGNOSIS — Z79899 Other long term (current) drug therapy: Secondary | ICD-10-CM | POA: Insufficient documentation

## 2012-08-12 DIAGNOSIS — K219 Gastro-esophageal reflux disease without esophagitis: Secondary | ICD-10-CM | POA: Insufficient documentation

## 2012-08-12 HISTORY — PX: CHOLECYSTECTOMY: SHX55

## 2012-08-12 SURGERY — LAPAROSCOPIC CHOLECYSTECTOMY
Anesthesia: General | Site: Abdomen | Wound class: Contaminated

## 2012-08-12 MED ORDER — BUPIVACAINE-EPINEPHRINE 0.25% -1:200000 IJ SOLN
INTRAMUSCULAR | Status: DC | PRN
  Administered 2012-08-12: 30 mL

## 2012-08-12 MED ORDER — HYDROMORPHONE HCL PF 1 MG/ML IJ SOLN
INTRAMUSCULAR | Status: AC
  Filled 2012-08-12: qty 1

## 2012-08-12 MED ORDER — BUPIVACAINE-EPINEPHRINE PF 0.25-1:200000 % IJ SOLN
INTRAMUSCULAR | Status: AC
  Filled 2012-08-12: qty 30

## 2012-08-12 MED ORDER — MEPERIDINE HCL 25 MG/ML IJ SOLN: 6.2500 mg | INTRAMUSCULAR | Status: DC | PRN

## 2012-08-12 MED ORDER — SODIUM CHLORIDE 0.9 % IR SOLN
Status: DC | PRN
  Administered 2012-08-12 (×2): 1000 mL

## 2012-08-12 MED ORDER — MIDAZOLAM HCL 2 MG/2ML IJ SOLN: 0.5000 mg | Freq: Once | INTRAMUSCULAR | Status: DC | PRN

## 2012-08-12 MED ORDER — HYDROCODONE-ACETAMINOPHEN 5-325 MG PO TABS: 1.0000 | ORAL_TABLET | ORAL | Status: DC | PRN

## 2012-08-12 MED ORDER — GLYCOPYRROLATE 0.2 MG/ML IJ SOLN
INTRAMUSCULAR | Status: DC | PRN
  Administered 2012-08-12: 0.4 mg via INTRAVENOUS

## 2012-08-12 MED ORDER — LACTATED RINGERS IV SOLN
INTRAVENOUS | Status: DC
  Administered 2012-08-12: 13:00:00 via INTRAVENOUS

## 2012-08-12 MED ORDER — PROPOFOL 10 MG/ML IV BOLUS
INTRAVENOUS | Status: DC | PRN
  Administered 2012-08-12: 170 mg via INTRAVENOUS

## 2012-08-12 MED ORDER — NEOSTIGMINE METHYLSULFATE 1 MG/ML IJ SOLN
INTRAMUSCULAR | Status: DC | PRN
  Administered 2012-08-12: 3 mg via INTRAVENOUS

## 2012-08-12 MED ORDER — OXYCODONE HCL 5 MG PO TABS
5.0000 mg | ORAL_TABLET | Freq: Once | ORAL | Status: AC | PRN
  Administered 2012-08-12: 5 mg via ORAL

## 2012-08-12 MED ORDER — OXYCODONE HCL 5 MG PO TABS
ORAL_TABLET | ORAL | Status: AC
  Filled 2012-08-12: qty 1

## 2012-08-12 MED ORDER — MIDAZOLAM HCL 5 MG/5ML IJ SOLN
INTRAMUSCULAR | Status: DC | PRN
  Administered 2012-08-12: 2 mg via INTRAVENOUS

## 2012-08-12 MED ORDER — HYDROMORPHONE HCL PF 1 MG/ML IJ SOLN
0.2500 mg | INTRAMUSCULAR | Status: DC | PRN
  Administered 2012-08-12 (×2): 0.5 mg via INTRAVENOUS

## 2012-08-12 MED ORDER — KETOROLAC TROMETHAMINE 30 MG/ML IJ SOLN
INTRAMUSCULAR | Status: DC | PRN
  Administered 2012-08-12: 30 mg via INTRAVENOUS

## 2012-08-12 MED ORDER — LACTATED RINGERS IV SOLN
INTRAVENOUS | Status: DC | PRN
  Administered 2012-08-12: 14:00:00 via INTRAVENOUS

## 2012-08-12 MED ORDER — ONDANSETRON HCL 4 MG/2ML IJ SOLN
INTRAMUSCULAR | Status: DC | PRN
  Administered 2012-08-12: 4 mg via INTRAVENOUS

## 2012-08-12 MED ORDER — OXYCODONE HCL 5 MG/5ML PO SOLN: 5.0000 mg | Freq: Once | ORAL | Status: AC | PRN

## 2012-08-12 MED ORDER — ROCURONIUM BROMIDE 100 MG/10ML IV SOLN
INTRAVENOUS | Status: DC | PRN
  Administered 2012-08-12: 30 mg via INTRAVENOUS

## 2012-08-12 MED ORDER — LIDOCAINE HCL (CARDIAC) 20 MG/ML IV SOLN
INTRAVENOUS | Status: DC | PRN
  Administered 2012-08-12: 80 mg via INTRAVENOUS

## 2012-08-12 MED ORDER — PROMETHAZINE HCL 25 MG/ML IJ SOLN: 6.2500 mg | INTRAMUSCULAR | Status: DC | PRN

## 2012-08-12 MED ORDER — FENTANYL CITRATE 0.05 MG/ML IJ SOLN
INTRAMUSCULAR | Status: DC | PRN
  Administered 2012-08-12 (×2): 25 ug via INTRAVENOUS
  Administered 2012-08-12: 100 ug via INTRAVENOUS
  Administered 2012-08-12 (×2): 50 ug via INTRAVENOUS

## 2012-08-12 SURGICAL SUPPLY — 41 items
APL SKNCLS STERI-STRIP NONHPOA (GAUZE/BANDAGES/DRESSINGS) ×1
APPLIER CLIP 5 13 M/L LIGAMAX5 (MISCELLANEOUS) ×2
APR CLP MED LRG 5 ANG JAW (MISCELLANEOUS) ×1
BAG SPEC RTRVL LRG 6X4 10 (ENDOMECHANICALS) ×1
BANDAGE ADHESIVE 1X3 (GAUZE/BANDAGES/DRESSINGS) ×8 IMPLANT
BENZOIN TINCTURE PRP APPL 2/3 (GAUZE/BANDAGES/DRESSINGS) ×2 IMPLANT
CANISTER SUCTION 2500CC (MISCELLANEOUS) ×2 IMPLANT
CHLORAPREP W/TINT 26ML (MISCELLANEOUS) ×2 IMPLANT
CLIP APPLIE 5 13 M/L LIGAMAX5 (MISCELLANEOUS) ×1 IMPLANT
CLOTH BEACON ORANGE TIMEOUT ST (SAFETY) ×2 IMPLANT
CLSR STERI-STRIP ANTIMIC 1/2X4 (GAUZE/BANDAGES/DRESSINGS) ×1 IMPLANT
COVER MAYO STAND STRL (DRAPES) IMPLANT
COVER SURGICAL LIGHT HANDLE (MISCELLANEOUS) ×2 IMPLANT
DECANTER SPIKE VIAL GLASS SM (MISCELLANEOUS) ×2 IMPLANT
DRAPE C-ARM 42X72 X-RAY (DRAPES) IMPLANT
ELECT REM PT RETURN 9FT ADLT (ELECTROSURGICAL) ×2
ELECTRODE REM PT RTRN 9FT ADLT (ELECTROSURGICAL) ×1 IMPLANT
GLOVE BIOGEL PI IND STRL 7.0 (GLOVE) IMPLANT
GLOVE BIOGEL PI IND STRL 7.5 (GLOVE) IMPLANT
GLOVE BIOGEL PI INDICATOR 7.0 (GLOVE) ×2
GLOVE BIOGEL PI INDICATOR 7.5 (GLOVE) ×1
GLOVE SURG SIGNA 7.5 PF LTX (GLOVE) ×2 IMPLANT
GOWN STRL NON-REIN LRG LVL3 (GOWN DISPOSABLE) ×6 IMPLANT
GOWN STRL REIN XL XLG (GOWN DISPOSABLE) ×2 IMPLANT
KIT BASIN OR (CUSTOM PROCEDURE TRAY) ×2 IMPLANT
KIT ROOM TURNOVER OR (KITS) ×2 IMPLANT
NS IRRIG 1000ML POUR BTL (IV SOLUTION) ×2 IMPLANT
PAD ARMBOARD 7.5X6 YLW CONV (MISCELLANEOUS) ×4 IMPLANT
POUCH SPECIMEN RETRIEVAL 10MM (ENDOMECHANICALS) ×1 IMPLANT
SCISSORS LAP 5X35 DISP (ENDOMECHANICALS) IMPLANT
SET CHOLANGIOGRAPH 5 50 .035 (SET/KITS/TRAYS/PACK) IMPLANT
SET IRRIG TUBING LAPAROSCOPIC (IRRIGATION / IRRIGATOR) ×2 IMPLANT
SLEEVE ENDOPATH XCEL 5M (ENDOMECHANICALS) ×4 IMPLANT
SPECIMEN JAR SMALL (MISCELLANEOUS) ×2 IMPLANT
SUT MON AB 4-0 PC3 18 (SUTURE) ×2 IMPLANT
TOWEL OR 17X24 6PK STRL BLUE (TOWEL DISPOSABLE) ×2 IMPLANT
TOWEL OR 17X26 10 PK STRL BLUE (TOWEL DISPOSABLE) ×2 IMPLANT
TRAY LAPAROSCOPIC (CUSTOM PROCEDURE TRAY) ×2 IMPLANT
TROCAR XCEL BLUNT TIP 100MML (ENDOMECHANICALS) ×2 IMPLANT
TROCAR XCEL NON-BLD 5MMX100MML (ENDOMECHANICALS) ×2 IMPLANT
WATER STERILE IRR 1000ML POUR (IV SOLUTION) IMPLANT

## 2012-08-12 NOTE — Progress Notes (Signed)
CLIENT VOIDED,NO C/O NAUSEA, STATES PAIN DECREASED AND STATES READY TO GO HOME

## 2012-08-12 NOTE — Interval H&P Note (Signed)
History and Physical Interval Note: no change in H and P  08/12/2012 1:32 PM  Jacqueline Baxter  has presented today for surgery, with the diagnosis of Gallstones, Symptomatic Cholelithiasis  The various methods of treatment have been discussed with the patient and family. After consideration of risks, benefits and other options for treatment, the patient has consented to  Procedure(s): LAPAROSCOPIC CHOLECYSTECTOMY POSSIBLE IOC (N/A) as a surgical intervention .  The patient's history has been reviewed, patient examined, no change in status, stable for surgery.  I have reviewed the patient's chart and labs.  Questions were answered to the patient's satisfaction.     [name] A

## 2012-08-12 NOTE — Anesthesia Postprocedure Evaluation (Signed)
  Anesthesia Post-op Note  Patient: Jacqueline Baxter  Procedure(s) Performed: Procedure(s): LAPAROSCOPIC CHOLECYSTECTOMY POSSIBLE IOC (N/A)  Patient Location: PACU  Anesthesia Type:General  Level of Consciousness: awake, alert , oriented and patient cooperative  Airway and Oxygen Therapy: Patient Spontanous Breathing  Post-op Pain: none  Post-op Assessment: Post-op Vital signs reviewed, Patient's Cardiovascular Status Stable, Respiratory Function Stable, Patent Airway, No signs of Nausea or vomiting and Pain level controlled  Post-op Vital Signs: Reviewed and stable  Complications: No apparent anesthesia complications

## 2012-08-12 NOTE — Anesthesia Preprocedure Evaluation (Addendum)
Anesthesia Evaluation  Patient identified by MRN, date of birth, ID band Patient awake    Reviewed: Allergy & Precautions, H&P , NPO status , Patient's Chart, lab work & pertinent test results, reviewed documented beta blocker date and time   History of Anesthesia Complications Negative for: history of anesthetic complications  Airway Mallampati: II TM Distance: >3 FB Neck ROM: Full    Dental  (+) Teeth Intact and Dental Advisory Given   Pulmonary Current Smoker,          Cardiovascular     Neuro/Psych  Headaches, negative psych ROS   GI/Hepatic Neg liver ROS, GERD-  Medicated and Controlled,  Endo/Other  Morbid obesity  Renal/GU negative Renal ROS     Musculoskeletal negative musculoskeletal ROS (+)   Abdominal   Peds  Hematology negative hematology ROS (+)   Anesthesia Other Findings   Reproductive/Obstetrics negative OB ROS                         Anesthesia Physical Anesthesia Plan  ASA: II  Anesthesia Plan: General   Post-op Pain Management:    Induction: Intravenous  Airway Management Planned: Oral ETT  Additional Equipment:   Intra-op Plan:   Post-operative Plan: Extubation in OR  Informed Consent:   Plan Discussed with:   Anesthesia Plan Comments:         Anesthesia Quick Evaluation

## 2012-08-12 NOTE — Op Note (Signed)
Laparoscopic Cholecystectomy Procedure Note  Indications: This patient presents with symptomatic gallbladder disease and will undergo laparoscopic cholecystectomy.  Pre-operative Diagnosis: Calculus of gallbladder with other cholecystitis, without mention of obstruction  Post-operative Diagnosis: Same  Surgeon: Abigail Miyamoto A   Assistants: 0  Anesthesia: General endotracheal anesthesia  ASA Class: 2  Procedure Details  The patient was seen again in the Holding Room. The risks, benefits, complications, treatment options, and expected outcomes were discussed with the patient. The possibilities of reaction to medication, pulmonary aspiration, perforation of viscus, bleeding, recurrent infection, finding a normal gallbladder, the need for additional procedures, failure to diagnose a condition, the possible need to convert to an open procedure, and creating a complication requiring transfusion or operation were discussed with the patient. The likelihood of improving the patient's symptoms with return to their baseline status is good.  The patient and/or family concurred with the proposed plan, giving informed consent. The site of surgery properly noted. The patient was taken to Operating Room, identified as Jacqueline Baxter and the procedure verified as Laparoscopic Cholecystectomy with Intraoperative Cholangiogram. A Time Out was held and the above information confirmed.  Prior to the induction of general anesthesia, antibiotic prophylaxis was administered. General endotracheal anesthesia was then administered and tolerated well. After the induction, the abdomen was prepped with Chloraprep and draped in sterile fashion. The patient was positioned in the supine position.  Local anesthetic agent was injected into the skin near the umbilicus and an incision made. We dissected down to the abdominal fascia with blunt dissection.  The fascia was incised vertically and we entered the peritoneal cavity  bluntly.  A pursestring suture of 0-Vicryl was placed around the fascial opening.  The Hasson cannula was inserted and secured with the stay suture.  Pneumoperitoneum was then created with CO2 and tolerated well without any adverse changes in the patient's vital signs. An 11-mm port was placed in the subxiphoid position.  Two 5-mm ports were placed in the right upper quadrant. All skin incisions were infiltrated with a local anesthetic agent before making the incision and placing the trocars.   We positioned the patient in reverse Trendelenburg, tilted slightly to the patient's left.  The gallbladder was identified, the fundus grasped and retracted cephalad. Adhesions were lysed bluntly and with the electrocautery where indicated, taking care not to injure any adjacent organs or viscus. The infundibulum was grasped and retracted laterally, exposing the peritoneum overlying the triangle of Calot. This was then divided and exposed in a blunt fashion. The cystic duct was clearly identified and bluntly dissected circumferentially. A critical view of the cystic duct and cystic artery was obtained.  The cystic duct was then ligated with clips and divided. The cystic artery was, dissected free, ligated with clips and divided as well.   The gallbladder was dissected from the liver bed in retrograde fashion with the electrocautery. The gallbladder was removed and placed in an Endocatch sac. The liver bed was irrigated and inspected. Hemostasis was achieved with the electrocautery. Copious irrigation was utilized and was repeatedly aspirated until clear.  The gallbladder and Endocatch sac were then removed through the umbilical port site.  The pursestring suture was used to close the umbilical fascia.    We again inspected the right upper quadrant for hemostasis.  Pneumoperitoneum was released as we removed the trocars.  4-0 Monocryl was used to close the skin.   Benzoin, steri-strips, and clean dressings were applied.  The patient was then extubated and brought to the recovery  room in stable condition. Instrument, sponge, and needle counts were correct at closure and at the conclusion of the case.   Findings: Chronic Cholecystitis with Cholelithiasis  Estimated Blood Loss: Minimal         Drains: 0         Specimens: Gallbladder           Complications: None; patient tolerated the procedure well.         Disposition: PACU - hemodynamically stable.         Condition: stable

## 2012-08-12 NOTE — Transfer of Care (Signed)
Immediate Anesthesia Transfer of Care Note  Patient: Jacqueline Baxter  Procedure(s) Performed: Procedure(s): LAPAROSCOPIC CHOLECYSTECTOMY POSSIBLE IOC (N/A)  Patient Location: PACU  Anesthesia Type:General  Level of Consciousness: awake, alert  and oriented  Airway & Oxygen Therapy: Patient Spontanous Breathing and Patient connected to nasal cannula oxygen  Post-op Assessment: Report given to PACU RN and Post -op Vital signs reviewed and stable  Post vital signs: Reviewed and stable  Complications: No apparent anesthesia complications

## 2012-08-12 NOTE — Preoperative (Signed)
Beta Blockers   Reason not to administer Beta Blockers:Not Applicable 

## 2012-08-13 ENCOUNTER — Encounter (HOSPITAL_COMMUNITY): Payer: Self-pay | Admitting: Surgery

## 2012-08-23 ENCOUNTER — Encounter (INDEPENDENT_AMBULATORY_CARE_PROVIDER_SITE_OTHER): Payer: Self-pay | Admitting: Surgery

## 2012-08-23 ENCOUNTER — Ambulatory Visit (INDEPENDENT_AMBULATORY_CARE_PROVIDER_SITE_OTHER): Payer: 59 | Admitting: Surgery

## 2012-08-23 VITALS — BP 110/72 | HR 85 | Temp 98.1°F | Resp 18 | Ht 61.0 in | Wt 174.0 lb

## 2012-08-23 DIAGNOSIS — Z09 Encounter for follow-up examination after completed treatment for conditions other than malignant neoplasm: Secondary | ICD-10-CM

## 2012-08-23 NOTE — Progress Notes (Signed)
Subjective:     Patient ID: Jacqueline Baxter, female   DOB: Jul 12, 1965, 47 y.o.   MRN: [id number]  HPI She is here for first postop visit status post laparoscopic cholecystectomy. She is doing fairly well. She is having some loose bowel movements. She is still smoking and does report some mild exertional shortness of breath. She denies any shortness of breath with normal activity and sitting  Review of Systems     Objective:   Physical Exam On exam, she is well her parents. Her vitals are stable. Her abdomen is soft and her incisions are well-healed.  The final pathology showed chronic cholecystitis with gallstones    Assessment:     Patient stable postop     Plan:     Should her shortness of breath acutely increased she will go to the emergency department. If it does not resolve we will get a chest x-ray or a CAT scan. I encouraged her to quit smoking. I will otherwise see her back as needed

## 2012-12-20 ENCOUNTER — Other Ambulatory Visit: Payer: Self-pay

## 2012-12-20 DIAGNOSIS — Z1231 Encounter for screening mammogram for malignant neoplasm of breast: Secondary | ICD-10-CM

## 2013-01-20 ENCOUNTER — Ambulatory Visit: Payer: 59

## 2013-02-28 ENCOUNTER — Emergency Department (HOSPITAL_COMMUNITY)
Admission: EM | Admit: 2013-02-28 | Discharge: 2013-02-28 | Disposition: A | Discharge status: Home / Self Care | Payer: 59 | Source: Home / Self Care | Attending: Family Medicine | Admitting: Family Medicine

## 2013-02-28 ENCOUNTER — Emergency Department (INDEPENDENT_AMBULATORY_CARE_PROVIDER_SITE_OTHER): Payer: 59

## 2013-02-28 ENCOUNTER — Encounter (HOSPITAL_COMMUNITY): Payer: Self-pay | Admitting: Emergency Medicine

## 2013-02-28 DIAGNOSIS — J40 Bronchitis, not specified as acute or chronic: Secondary | ICD-10-CM

## 2013-02-28 DIAGNOSIS — R05 Cough: Secondary | ICD-10-CM

## 2013-02-28 DIAGNOSIS — R059 Cough, unspecified: Secondary | ICD-10-CM

## 2013-02-28 LAB — POCT PREGNANCY, URINE: PREG TEST UR: NEGATIVE

## 2013-02-28 LAB — POCT URINALYSIS DIP (DEVICE)
BILIRUBIN URINE: NEGATIVE
Glucose, UA: NEGATIVE mg/dL
Hgb urine dipstick: NEGATIVE
Ketones, ur: NEGATIVE mg/dL
LEUKOCYTES UA: NEGATIVE
Nitrite: NEGATIVE
PROTEIN: 30 mg/dL — AB
Specific Gravity, Urine: 1.025 (ref 1.005–1.030)
Urobilinogen, UA: 0.2 mg/dL (ref 0.0–1.0)
pH: 6.5 (ref 5.0–8.0)

## 2013-02-28 MED ORDER — BENZONATATE 100 MG PO CAPS: 100.0000 mg | ORAL_CAPSULE | Freq: Three times a day (TID) | ORAL | Status: DC | PRN

## 2013-02-28 MED ORDER — AZITHROMYCIN 250 MG PO TABS: 250.0000 mg | ORAL_TABLET | Freq: Every day | ORAL | Status: DC

## 2013-02-28 NOTE — ED Notes (Signed)
C/o body aches.  Chills.  Productive cough with clear sputum.  Nausea.  Right flank pain, denies urinary symptoms.   Mild relief with otc meds.  Symptoms present since Friday.  Denies fever and diarrhea.

## 2013-02-28 NOTE — ED Provider Notes (Signed)
CSN: [id number]     Arrival date & time 02/28/13  1034 History   First MD Initiated Contact with Patient 02/28/13 1220     Chief Complaint  Patient presents with  . URI  . Flank Pain   (Consider location/radiation/quality/duration/timing/severity/associated sxs/prior Treatment) HPI Comments: Patient reports she has had about 2 weeks of cough with mild URI symptoms. 48 hours PTA, cough worsened, she developed myalgias, malaise, chills, and nausea without vomiting or diarrhea. 24 hours PTA she began having left flank discomfort that is exacerbated by movement of her torso or deep inspiration. Tmax 99.3. Patient is a smoker. Denies increased work of breathing, but is apprehensive to take deep breath or cough as it make her left flank hurt. Denies any GU symptoms. Cough has worsened over past 48 hours.   The history is provided by the patient.    Past Medical History  Diagnosis Date  . Acid reflux   . Vitamin D deficiency disease   . Hyperlipidemia   . Arthritis   . YIRSWNIO(270.3)    Past Surgical History  Procedure Laterality Date  . Tonsillectomy    . Cholecystectomy N/A 08/12/2012    Procedure: LAPAROSCOPIC CHOLECYSTECTOMY POSSIBLE IOC;  Surgeon: Harl Bowie, MD;  Location: [facility];  Service: General;  Laterality: N/A;   Family History  Problem Relation Age of Onset  . Diabetes Mother    History  Substance Use Topics  . Smoking status: Current Every Day Smoker -- 0.25 packs/day for 10 years    Types: Cigarettes  . Smokeless tobacco: Never Used     Comment: OCC ALCOHOL  . Alcohol Use: No     Comment: rare   OB History   Grav Para Term Preterm Abortions TAB SAB Ect Mult Living                 Review of Systems  All other systems reviewed and are negative.    Allergies  Penicillins  Home Medications   Current Outpatient Rx  Name  Route  Sig  Dispense  Refill  . butalbital-acetaminophen-caffeine (FIORICET, ESGIC) 50-325-40 MG per tablet   Oral   Take 1  tablet by mouth every 4 (four) hours as needed for headache.          Marland Kitchen HYDROcodone-acetaminophen (NORCO) 5-325 MG per tablet   Oral   Take 1-2 tablets by mouth every 4 (four) hours as needed for pain.   30 tablet   1   . Multiple Vitamin (MULTIVITAMIN WITH MINERALS) TABS   Oral   Take 1 tablet by mouth daily.         Earney Navy Bicarbonate (ZEGERID PO)   Oral   Take 1 capsule by mouth daily.          . Probiotic Product (PROBIOTIC PEARLS PO)   Oral   Take 1 capsule by mouth daily.          . vitamin C (ASCORBIC ACID) 500 MG tablet   Oral   Take 500 mg by mouth daily.          BP 129/73  Pulse 64  Temp(Src) 97.8 F (36.6 C) (Oral)  Resp 16  SpO2 100% Physical Exam  Nursing note and vitals reviewed. Constitutional: She is oriented to person, place, and time. She appears well-developed and well-nourished. No distress.  HENT:  Head: Normocephalic and atraumatic.  Eyes: Conjunctivae are normal. Right eye exhibits no discharge. Left eye exhibits no discharge. No scleral icterus.  Neck: Normal  range of motion. Neck supple.  Cardiovascular: Normal rate, regular rhythm and normal heart sounds.   Pulmonary/Chest: Effort normal.  Breat sound normal with exception of mild crackles at left base.   Abdominal: Soft. Bowel sounds are normal. She exhibits no distension. There is no tenderness. There is no CVA tenderness.  Musculoskeletal: Normal range of motion.  Neurological: She is alert and oriented to person, place, and time.  Skin: Skin is warm and dry.  Psychiatric: She has a normal mood and affect. Her behavior is normal.    ED Course  Procedures (including critical care time) Labs Review Labs Reviewed  POCT URINALYSIS DIP (DEVICE) - Abnormal; Notable for the following:    Protein, ur 30 (*)    All other components within normal limits  POCT PREGNANCY, URINE   Imaging Review No results found.  EKG Interpretation    Date/Time:    Ventricular  Rate:    PR Interval:    QRS Duration:   QT Interval:    QTC Calculation:   R Axis:     Text Interpretation:              MDM  While I do not suspect this patient has a PE, I am concerned that she may have an early infectious  process at the base of her left lung (given abnormal lung sounds and pain in this area with inspiration) that may not yet appear on her chest xray. Will treat with 5 days of azithromycin and advise close follow up with PCP if no improvement.     [place], [place] 02/28/13 1334

## 2013-02-28 NOTE — Discharge Instructions (Signed)

## 2013-03-01 NOTE — ED Provider Notes (Signed)
Medical screening examination/treatment/procedure(s) were performed by a resident physician or non-physician practitioner and as the supervising physician I was immediately available for consultation/collaboration.  Lynne Leader, MD    Gregor Hams, MD 03/01/13 [phone]

## 2013-07-22 ENCOUNTER — Other Ambulatory Visit: Payer: Self-pay | Admitting: Obstetrics and Gynecology

## 2013-07-22 DIAGNOSIS — R928 Other abnormal and inconclusive findings on diagnostic imaging of breast: Secondary | ICD-10-CM

## 2013-07-27 ENCOUNTER — Encounter (INDEPENDENT_AMBULATORY_CARE_PROVIDER_SITE_OTHER): Payer: Self-pay

## 2013-07-27 ENCOUNTER — Ambulatory Visit
Admission: RE | Admit: 2013-07-27 | Discharge: 2013-07-27 | Disposition: A | Discharge status: Home / Self Care | Payer: 59 | Source: Ambulatory Visit | Attending: Obstetrics and Gynecology | Admitting: Obstetrics and Gynecology

## 2013-07-27 DIAGNOSIS — R928 Other abnormal and inconclusive findings on diagnostic imaging of breast: Secondary | ICD-10-CM

## 2014-01-06 ENCOUNTER — Other Ambulatory Visit: Payer: Self-pay | Admitting: Obstetrics and Gynecology

## 2014-01-06 DIAGNOSIS — N6310 Unspecified lump in the right breast, unspecified quadrant: Secondary | ICD-10-CM

## 2014-01-26 ENCOUNTER — Ambulatory Visit
Admission: RE | Admit: 2014-01-26 | Discharge: 2014-01-26 | Disposition: A | Discharge status: Home / Self Care | Payer: 59 | Source: Ambulatory Visit | Attending: Obstetrics and Gynecology | Admitting: Obstetrics and Gynecology

## 2014-01-26 ENCOUNTER — Other Ambulatory Visit: Payer: Self-pay | Admitting: Obstetrics and Gynecology

## 2014-01-26 DIAGNOSIS — N6310 Unspecified lump in the right breast, unspecified quadrant: Secondary | ICD-10-CM

## 2014-07-18 ENCOUNTER — Other Ambulatory Visit: Payer: Self-pay | Admitting: Obstetrics and Gynecology

## 2014-07-18 DIAGNOSIS — D241 Benign neoplasm of right breast: Secondary | ICD-10-CM

## 2014-08-04 ENCOUNTER — Ambulatory Visit
Admission: RE | Admit: 2014-08-04 | Discharge: 2014-08-04 | Disposition: A | Discharge status: Home / Self Care | Payer: 59 | Source: Ambulatory Visit | Attending: Obstetrics and Gynecology | Admitting: Obstetrics and Gynecology

## 2014-08-04 DIAGNOSIS — D241 Benign neoplasm of right breast: Secondary | ICD-10-CM

## 2015-02-18 HISTORY — PX: BREAST BIOPSY: SHX20

## 2015-08-02 ENCOUNTER — Institutional Professional Consult (permissible substitution): Payer: 59 | Admitting: Neurology

## 2015-08-30 ENCOUNTER — Institutional Professional Consult (permissible substitution): Payer: 59 | Admitting: Neurology

## 2015-08-30 ENCOUNTER — Telehealth: Payer: Self-pay | Admitting: Neurology

## 2015-08-30 NOTE — Telephone Encounter (Signed)
Patient called 7/13 9:41:15am to cancel today's appointment with Dr. Brett Fairy, patient advised of $50 NS/Cx < 24hr fee, call transferred to Angie/Billing.

## 2015-08-30 NOTE — Telephone Encounter (Signed)
Pt cancelled same day appt- counts as a no show.

## 2015-11-15 ENCOUNTER — Other Ambulatory Visit: Payer: Self-pay | Admitting: Nurse Practitioner

## 2015-11-15 DIAGNOSIS — Z1231 Encounter for screening mammogram for malignant neoplasm of breast: Secondary | ICD-10-CM

## 2015-11-22 ENCOUNTER — Ambulatory Visit
Admission: RE | Admit: 2015-11-22 | Discharge: 2015-11-22 | Disposition: A | Discharge status: Home / Self Care | Payer: 59 | Source: Ambulatory Visit | Attending: Nurse Practitioner | Admitting: Nurse Practitioner

## 2015-11-22 DIAGNOSIS — Z1231 Encounter for screening mammogram for malignant neoplasm of breast: Secondary | ICD-10-CM

## 2015-11-29 ENCOUNTER — Other Ambulatory Visit: Payer: Self-pay | Admitting: Nurse Practitioner

## 2015-11-29 DIAGNOSIS — R928 Other abnormal and inconclusive findings on diagnostic imaging of breast: Secondary | ICD-10-CM

## 2015-12-05 ENCOUNTER — Ambulatory Visit
Admission: RE | Admit: 2015-12-05 | Discharge: 2015-12-05 | Disposition: A | Discharge status: Home / Self Care | Payer: 59 | Source: Ambulatory Visit | Attending: Nurse Practitioner | Admitting: Nurse Practitioner

## 2015-12-05 ENCOUNTER — Other Ambulatory Visit: Payer: Self-pay | Admitting: Nurse Practitioner

## 2015-12-05 DIAGNOSIS — R921 Mammographic calcification found on diagnostic imaging of breast: Secondary | ICD-10-CM

## 2015-12-05 DIAGNOSIS — R928 Other abnormal and inconclusive findings on diagnostic imaging of breast: Secondary | ICD-10-CM

## 2015-12-07 ENCOUNTER — Ambulatory Visit
Admission: RE | Admit: 2015-12-07 | Discharge: 2015-12-07 | Disposition: A | Discharge status: Home / Self Care | Payer: 59 | Source: Ambulatory Visit | Attending: Nurse Practitioner | Admitting: Nurse Practitioner

## 2015-12-07 DIAGNOSIS — R921 Mammographic calcification found on diagnostic imaging of breast: Secondary | ICD-10-CM

## 2016-07-31 DIAGNOSIS — M25541 Pain in joints of right hand: Secondary | ICD-10-CM | POA: Diagnosis not present

## 2016-07-31 DIAGNOSIS — M2550 Pain in unspecified joint: Secondary | ICD-10-CM | POA: Diagnosis not present

## 2016-07-31 DIAGNOSIS — M25542 Pain in joints of left hand: Secondary | ICD-10-CM | POA: Diagnosis not present

## 2016-07-31 DIAGNOSIS — R51 Headache: Secondary | ICD-10-CM | POA: Diagnosis not present

## 2016-10-23 DIAGNOSIS — Z8719 Personal history of other diseases of the digestive system: Secondary | ICD-10-CM | POA: Insufficient documentation

## 2016-10-23 DIAGNOSIS — Z720 Tobacco use: Secondary | ICD-10-CM | POA: Insufficient documentation

## 2016-10-23 DIAGNOSIS — Z9049 Acquired absence of other specified parts of digestive tract: Secondary | ICD-10-CM | POA: Insufficient documentation

## 2016-10-23 DIAGNOSIS — Z87898 Personal history of other specified conditions: Secondary | ICD-10-CM | POA: Insufficient documentation

## 2016-10-23 DIAGNOSIS — M2550 Pain in unspecified joint: Secondary | ICD-10-CM | POA: Insufficient documentation

## 2016-10-23 DIAGNOSIS — R768 Other specified abnormal immunological findings in serum: Secondary | ICD-10-CM | POA: Insufficient documentation

## 2016-10-23 NOTE — Progress Notes (Signed)
Office Visit Note  Patient: Jacqueline Baxter             Date of Birth: 1965/05/02           MRN: [id number]             PCP: Minette Brine Referring: Minette Brine, FNP Visit Date: 10/28/2016 Occupation: Education officer, museum at [employer]:  New Patient (Initial Visit) and Joint Pain   History of Present Illness: Jacqueline Baxter is a 51 y.o. female seen in consultation per request of her PCP. According to patient her symptoms started approximately 5 years ago with joint pain. She states the pain moves around in different joints sometimes in her hands, elbows and her knee joints the symptoms do not last for very long. She has also noticed occasional swelling which she describes in her bilateral comp and bilateral wrist. About one month ago she had swelling in her left ankle joint. She's been experiencing some pain on the top of her left foot. She states due to symptoms of arthralgia her PCP did ANA which was positive few months back and then her repeat ANA was positive recently.  Activities of Daily Living:  Patient reports morning stiffness for 0 minute.   Patient Denies nocturnal pain.  Difficulty dressing/grooming: Denies Difficulty climbing stairs: Denies Difficulty getting out of chair: Denies Difficulty using hands for taps, buttons, cutlery, and/or writing: Denies   Review of Systems  Constitutional: Positive for fatigue. Negative for night sweats, weight gain, weight loss and weakness.  HENT: Positive for mouth dryness. Negative for mouth sores, trouble swallowing, trouble swallowing and nose dryness.   Eyes: Negative for pain, redness, visual disturbance and dryness.  Respiratory: Negative for cough, shortness of breath and difficulty breathing.   Cardiovascular: Negative for chest pain, palpitations, hypertension, irregular heartbeat and swelling in legs/feet.  Gastrointestinal: Negative for blood in stool, constipation and diarrhea.  Endocrine: Negative for  increased urination.  Genitourinary: Negative for vaginal dryness.  Musculoskeletal: Positive for arthralgias, joint pain and joint swelling. Negative for myalgias, muscle weakness, morning stiffness, muscle tenderness and myalgias.  Skin: Negative for color change, rash, hair loss, skin tightness, ulcers and sensitivity to sunlight.  Allergic/Immunologic: Negative for susceptible to infections.  Neurological: Negative for dizziness, memory loss and night sweats.  Hematological: Negative for swollen glands.  Psychiatric/Behavioral: Positive for depressed mood. Negative for sleep disturbance. The patient is nervous/anxious.     PMFS History:  Patient Active Problem List   Diagnosis Date Noted  . ANA positive 10/23/2016  . Multiple joint pain 10/23/2016  . History of headache 10/23/2016  . Tobacco use 10/23/2016  . History of gastroesophageal reflux (GERD) 10/23/2016  . History of cholecystectomy 10/23/2016  . Symptomatic cholelithiasis 07/20/2012    Past Medical History:  Diagnosis Date  . Acid reflux   . Arthritis   . Headache(784.0)   . Hyperlipidemia   . Vitamin D deficiency disease     Family History  Problem Relation Age of Onset  . Diabetes Mother    Past Surgical History:  Procedure Laterality Date  . CHOLECYSTECTOMY N/A 08/12/2012   Procedure: LAPAROSCOPIC CHOLECYSTECTOMY POSSIBLE IOC;  Surgeon: Harl Bowie, MD;  Location: [facility];  Service: General;  Laterality: N/A;  . TONSILLECTOMY     Social History   Social History Narrative  . No narrative on file     Objective: Vital Signs: BP 124/78 (BP Location: Right Arm)   Pulse 78   Resp  14   Ht 5\' 1"  (1.549 m)   Wt 177 lb (80.3 kg)   BMI 33.44 kg/m    Physical Exam  Constitutional: She is oriented to person, place, and time. She appears well-developed and well-nourished.  HENT:  Head: Normocephalic and atraumatic.  Eyes: Conjunctivae and EOM are normal.  Neck: Normal range of motion.    Cardiovascular: Normal rate, regular rhythm, normal heart sounds and intact distal pulses.   Pulmonary/Chest: Effort normal and breath sounds normal.  Abdominal: Soft. Bowel sounds are normal.  Lymphadenopathy:    She has no cervical adenopathy.  Neurological: She is alert and oriented to person, place, and time.  Skin: Skin is warm and dry. Capillary refill takes less than 2 seconds.  Psychiatric: She has a normal mood and affect. Her behavior is normal.  Nursing note and vitals reviewed.    Musculoskeletal Exam: C-spine and thoracic lumbar spine good range of motion. Shoulder joints elbow joints wrist joint MCPs PIPs DIPs with good range of motion with no synovitis or synovial thickening. Hip joints knee joints ankles MTPs PIPs DIPs with good range of motion with no synovitis. She has some tenderness across her MCPs and MTPs.  CDAI Exam: No CDAI exam completed.    Investigation: Findings:  07/31/2016 Uric Acid 4.8, CCP normal, RF normal, ANA positive ( no titer), and CBC normal    Imaging: Xr Foot 2 Views Left  Result Date: 10/28/2016 PIP and DIP joint space narrowing was noted. No MTP or intertarsal changes were noted. No erosive changes were noted. Impression mild osteoarthritic of feet.  Xr Foot 2 Views Right  Result Date: 10/28/2016 PIP and DIP joint space narrowing was noted. No MTP or intertarsal changes were noted. No erosive changes were noted. Impression mild osteoarthritic of feet.  Xr Hand 2 View Left  Result Date: 10/28/2016 Minimal PIP joint space narrowing was noted. No MCP or intercarpal joint space narrowing was noted. No erosive changes were noted. Impression: Mild osteoarthritic changes of the hand.  Xr Hand 2 View Right  Result Date: 10/28/2016 Minimal PIP joint space narrowing was noted. No MCP or intercarpal joint space narrowing was noted. No erosive changes were noted. Impression: Mild osteoarthritic changes of the hand.   Speciality Comments: No  specialty comments available.    Procedures:  No procedures performed Allergies: Penicillins   Assessment / Plan:     Visit Diagnoses: ANA positive: Patient gives history of 2 ANAs being positive. I do not have ANA titer. She gives history of fatigue, history of intermittent joint swelling and arthralgias. I do not see any synovitis on examination. She's does have some discomfort in her hands and feet on palpation. I will obtain following labs and x-rays today.  Multiple joint pain: Patient complains of migratory pain lasting for a few days which involves her elbows, wrist joints, hands, knees and her feet.  Bilateral hand pain - Plan: XR Hand 2 View Right, XR Hand 2 View Left: X-ray showed only mild osteoarthritis.  Bilateral foot pain - Plan: XR Foot 2 Views Left, XR Foot 2 Views Right : X-ray showed only mild osteoarthritis.  History of headache  Tobacco use  History of gastroesophageal reflux (GERD)  History of cholecystectomy  Orders: Orders Placed This Encounter  Procedures  . XR Foot 2 Views Left  . XR Foot 2 Views Right  . XR Hand 2 View Left  . XR Hand 2 View Right  . COMPLETE METABOLIC PANEL WITH GFR  . CK  .  Antinuclear Antib (ANA)  . Anti-DNA antibody, double-stranded  . Anti-scleroderma antibody  . Sjogrens syndrome-A extractable nuclear antibody  . Sjogrens syndrome-B extractable nuclear antibody  . RNP Antibody  . C3 and C4  . Beta-2 glycoprotein antibodies  . Cardiolipin antibodies, IgG, IgM, IgA  . Lupus Anticoagulant Eval w/Reflex   No orders of the defined types were placed in this encounter.   Face-to-face time spent with patient was 45 minutes. 50% of time was spent in counseling and coordination of care.  Follow-Up Instructions: Return for polyarthralgia, +ANA.   Bo Merino, MD  Note - This record has been created using Editor, commissioning.  Chart creation errors have been sought, but may not always  have been located. Such creation  errors do not reflect on  the standard of medical care.

## 2016-10-28 ENCOUNTER — Ambulatory Visit (INDEPENDENT_AMBULATORY_CARE_PROVIDER_SITE_OTHER): Payer: Self-pay

## 2016-10-28 ENCOUNTER — Ambulatory Visit (INDEPENDENT_AMBULATORY_CARE_PROVIDER_SITE_OTHER): Payer: 59

## 2016-10-28 ENCOUNTER — Ambulatory Visit (INDEPENDENT_AMBULATORY_CARE_PROVIDER_SITE_OTHER): Payer: 59 | Admitting: Rheumatology

## 2016-10-28 ENCOUNTER — Encounter: Payer: Self-pay | Admitting: Rheumatology

## 2016-10-28 VITALS — BP 124/78 | HR 78 | Resp 14 | Ht 61.0 in | Wt 177.0 lb

## 2016-10-28 DIAGNOSIS — M79641 Pain in right hand: Secondary | ICD-10-CM | POA: Diagnosis not present

## 2016-10-28 DIAGNOSIS — M79672 Pain in left foot: Secondary | ICD-10-CM

## 2016-10-28 DIAGNOSIS — M79642 Pain in left hand: Secondary | ICD-10-CM | POA: Diagnosis not present

## 2016-10-28 DIAGNOSIS — M79671 Pain in right foot: Secondary | ICD-10-CM | POA: Diagnosis not present

## 2016-10-28 DIAGNOSIS — R7689 Other specified abnormal immunological findings in serum: Secondary | ICD-10-CM

## 2016-10-28 DIAGNOSIS — M2550 Pain in unspecified joint: Secondary | ICD-10-CM | POA: Diagnosis not present

## 2016-10-28 DIAGNOSIS — Z8719 Personal history of other diseases of the digestive system: Secondary | ICD-10-CM

## 2016-10-28 DIAGNOSIS — Z87898 Personal history of other specified conditions: Secondary | ICD-10-CM

## 2016-10-28 DIAGNOSIS — R768 Other specified abnormal immunological findings in serum: Secondary | ICD-10-CM

## 2016-10-28 DIAGNOSIS — Z720 Tobacco use: Secondary | ICD-10-CM

## 2016-10-28 DIAGNOSIS — Z9049 Acquired absence of other specified parts of digestive tract: Secondary | ICD-10-CM

## 2016-10-29 LAB — COMPLETE METABOLIC PANEL WITH GFR
AG Ratio: 1.7 (calc) (ref 1.0–2.5)
ALT: 14 U/L (ref 6–29)
AST: 14 U/L (ref 10–35)
Albumin: 4.4 g/dL (ref 3.6–5.1)
Alkaline phosphatase (APISO): 50 U/L (ref 33–130)
BUN: 10 mg/dL (ref 7–25)
CHLORIDE: 103 mmol/L (ref 98–110)
CO2: 24 mmol/L (ref 20–32)
Calcium: 9.7 mg/dL (ref 8.6–10.4)
Creat: 0.69 mg/dL (ref 0.50–1.05)
GFR, Est African American: 117 mL/min/{1.73_m2} (ref >=60)
GFR, Est Non African American: 101 mL/min/{1.73_m2} (ref >=60)
GLOBULIN: 2.6 g/dL (ref 1.9–3.7)
Glucose, Bld: 93 mg/dL (ref 65–99)
Potassium: 4.5 mmol/L (ref 3.5–5.3)
SODIUM: 138 mmol/L (ref 135–146)
Total Bilirubin: 0.4 mg/dL (ref 0.2–1.2)
Total Protein: 7 g/dL (ref 6.1–8.1)

## 2016-10-29 LAB — BETA-2 GLYCOPROTEIN ANTIBODIES
Beta-2 Glyco 1 IgA: <9 SAU (ref <=20)
Beta-2 Glyco I IgG: <9 SGU (ref <=20)

## 2016-10-29 LAB — ANTI-DNA ANTIBODY, DOUBLE-STRANDED: ds DNA Ab: <1 IU/mL

## 2016-10-29 LAB — ANTI-SCLERODERMA ANTIBODY: Scleroderma (Scl-70) (ENA) Antibody, IgG: 2.7 AI — AB

## 2016-10-29 LAB — ANA: Anti Nuclear Antibody(ANA): NEGATIVE

## 2016-10-29 LAB — CK: Total CK: 111 U/L (ref 29–143)

## 2016-10-29 LAB — CARDIOLIPIN ANTIBODIES, IGG, IGM, IGA
Anticardiolipin IgA: <11 [APL'U]
Anticardiolipin IgG: <14 [GPL'U]
Anticardiolipin IgM: <12 [MPL'U]

## 2016-10-29 LAB — SJOGRENS SYNDROME-A EXTRACTABLE NUCLEAR ANTIBODY: SSA (RO) (ENA) ANTIBODY, IGG: NEGATIVE AI

## 2016-10-29 LAB — C3 AND C4
C3 COMPLEMENT: 153 mg/dL (ref 83–193)
C4 Complement: 29 mg/dL (ref 15–57)

## 2016-10-29 LAB — SJOGRENS SYNDROME-B EXTRACTABLE NUCLEAR ANTIBODY: SSB (La) (ENA) Antibody, IgG: <1 AI

## 2016-10-29 LAB — RNP ANTIBODY: Ribonucleic Protein(ENA) Antibody, IgG: <1 AI

## 2016-10-29 NOTE — Progress Notes (Signed)
Will discuss at fu visit

## 2016-10-31 LAB — LUPUS ANTICOAGULANT EVAL W/ REFLEX
PTT LA SCREEN: 39 s (ref <=40)
dRVVT Screen: 41 s (ref <=45)

## 2016-11-24 NOTE — Progress Notes (Signed)
Office Visit Note  Patient: Jacqueline Baxter             Date of Birth: [birth year]           MRN: [id number]             PCP: Minette Brine Referring: No ref. provider found Visit Date: 11/26/2016 Occupation: @GUAROCC @    Subjective:  Joint pain.   History of Present Illness: Jacqueline Baxter is a 51 y.o. female with history of positive ANA and osteoarthritis. She states she continues to have some stiffness in her hands and tingling in her hands when she is on a computer for a long time. Her feet discomfort is tolerable. She denies any history of Raynauds.  Activities of Daily Living:  Patient reports morning stiffness for 0 denies.   Patient Denies nocturnal pain.  Difficulty dressing/grooming: Denies Difficulty climbing stairs: Denies Difficulty getting out of chair: Denies Difficulty using hands for taps, buttons, cutlery, and/or writing: Denies   Review of Systems  Constitutional: Positive for fatigue. Negative for night sweats, weight gain, weight loss and weakness.  HENT: Positive for mouth dryness. Negative for mouth sores, trouble swallowing, trouble swallowing and nose dryness.   Eyes: Negative.  Negative for pain, redness, visual disturbance and dryness.  Respiratory: Negative.  Negative for cough, shortness of breath and difficulty breathing.   Cardiovascular: Negative.  Negative for chest pain, palpitations, hypertension, irregular heartbeat and swelling in legs/feet.  Gastrointestinal: Positive for nausea. Negative for blood in stool, constipation and diarrhea.  Endocrine: Negative.  Negative for increased urination.  Genitourinary: Negative.  Negative for vaginal dryness.  Musculoskeletal: Positive for arthralgias and joint pain. Negative for joint swelling, myalgias, muscle weakness, morning stiffness, muscle tenderness and myalgias.  Skin: Negative for color change, rash, hair loss, skin tightness, ulcers and sensitivity to sunlight.       Has knot on chest area,  with warmth, redness, around scar on her chest (keloid)  Allergic/Immunologic: Negative for susceptible to infections.  Neurological: Positive for headaches. Negative for dizziness, memory loss and night sweats.  Hematological: Negative.  Negative for swollen glands.  Psychiatric/Behavioral: Positive for depressed mood. Negative for suicidal ideas and sleep disturbance. The patient is not nervous/anxious.     PMFS History:  Patient Active Problem List   Diagnosis Date Noted  . ANA positive 10/23/2016  . Multiple joint pain 10/23/2016  . History of headache 10/23/2016  . Tobacco use 10/23/2016  . History of gastroesophageal reflux (GERD) 10/23/2016  . History of cholecystectomy 10/23/2016  . Symptomatic cholelithiasis 07/20/2012    Past Medical History:  Diagnosis Date  . Acid reflux   . Arthritis   . Headache(784.0)   . Hyperlipidemia   . Vitamin D deficiency disease     Family History  Problem Relation Age of Onset  . Diabetes Mother    Past Surgical History:  Procedure Laterality Date  . CHOLECYSTECTOMY N/A 08/12/2012   Procedure: LAPAROSCOPIC CHOLECYSTECTOMY POSSIBLE IOC;  Surgeon: Harl Bowie, MD;  Location: [facility];  Service: General;  Laterality: N/A;  . TONSILLECTOMY     Social History   Social History Narrative  . No narrative on file     Objective: Vital Signs: BP 130/60   Pulse 78   Resp 16   Ht 5' 1.5" (1.562 m)   Wt 172 lb (78 kg)   BMI 31.97 kg/m    Physical Exam  Constitutional: She is oriented to person, place, and time. She appears well-developed  and well-nourished.  HENT:  Head: Normocephalic and atraumatic.  Eyes: Conjunctivae and EOM are normal.  Neck: Normal range of motion.  Cardiovascular: Normal rate, regular rhythm, normal heart sounds and intact distal pulses.   Pulmonary/Chest: Effort normal and breath sounds normal.  Abdominal: Soft. Bowel sounds are normal.  Lymphadenopathy:    She has no cervical adenopathy.    Neurological: She is alert and oriented to person, place, and time.  Skin: Skin is warm and dry. Capillary refill takes less than 2 seconds.  She has no evidence of sclerodactyly or periungal erythema. No telangiectasias were noted. A small cyst noted on the left side of her chest  Psychiatric: She has a normal mood and affect. Her behavior is normal.  Nursing note and vitals reviewed.    Musculoskeletal Exam: C-spine and thoracic lumbar spine good range of motion. Shoulder joints elbow joints wrist joint MCPs PIPs DIPs with good range of motion. Hip joints knee joints ankles MTPs PIPs DIPs with good range of motion.  CDAI Exam: No CDAI exam completed.    Investigation: No additional findings. 10/28/2016 CMP normal, CK normal, ANA negative, ENA SCL 70 positive(dsDNA, SSA, SSB, RNP negative), C3-C4 normal, beta-2 negative, anticardiolipin negative, lupus anticoagulant negative  Imaging: Xr Foot 2 Views Left  Result Date: 10/28/2016 PIP and DIP joint space narrowing was noted. No MTP or intertarsal changes were noted. No erosive changes were noted. Impression mild osteoarthritic of feet.  Xr Foot 2 Views Right  Result Date: 10/28/2016 PIP and DIP joint space narrowing was noted. No MTP or intertarsal changes were noted. No erosive changes were noted. Impression mild osteoarthritic of feet.  Xr Hand 2 View Left  Result Date: 10/28/2016 Minimal PIP joint space narrowing was noted. No MCP or intercarpal joint space narrowing was noted. No erosive changes were noted. Impression: Mild osteoarthritic changes of the hand.  Xr Hand 2 View Right  Result Date: 10/28/2016 Minimal PIP joint space narrowing was noted. No MCP or intercarpal joint space narrowing was noted. No erosive changes were noted. Impression: Mild osteoarthritic changes of the hand.   Speciality Comments: No specialty comments available.    Procedures:  No procedures performed Allergies: Penicillins   Assessment /  Plan:     Visit Diagnoses: ANA positive - Positive ANA in the past negative normal, SCL 70 positive. History of fatigue, intermittent joint swelling and arthralgias. Patient had no synovitis on examination. Her labs are unremarkable except for SCL 70 being positive. She has no clinical features of scleroderma. We had detailed discussion regarding the lab work.  Primary osteoarthritis of both hands - Mild. 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 There was no synovitis on exam.  Primary osteoarthritis of both feet - Mild. Proper fitting shoes were discussed.  Tobacco use: I had detailed discussion with patient regarding smoking cessation.   History of headache  History of gastroesophageal reflux (GERD)    Orders: No orders of the defined types were placed in this encounter.  No orders of the defined types were placed in this encounter.    Follow-Up Instructions: Return in about 1 year (around 11/26/2017).   Bo Merino, MD  Note - This record has been created using Editor, commissioning.  Chart creation errors have been sought, but may not always  have been located. Such creation errors do not reflect on  the standard of medical care.

## 2016-11-26 ENCOUNTER — Encounter: Payer: Self-pay | Admitting: Rheumatology

## 2016-11-26 ENCOUNTER — Ambulatory Visit (INDEPENDENT_AMBULATORY_CARE_PROVIDER_SITE_OTHER): Payer: 59 | Admitting: Rheumatology

## 2016-11-26 VITALS — BP 130/60 | HR 78 | Resp 16 | Ht 61.5 in | Wt 172.0 lb

## 2016-11-26 DIAGNOSIS — Z720 Tobacco use: Secondary | ICD-10-CM

## 2016-11-26 DIAGNOSIS — M19072 Primary osteoarthritis, left ankle and foot: Secondary | ICD-10-CM

## 2016-11-26 DIAGNOSIS — M19042 Primary osteoarthritis, left hand: Secondary | ICD-10-CM | POA: Diagnosis not present

## 2016-11-26 DIAGNOSIS — Z87898 Personal history of other specified conditions: Secondary | ICD-10-CM

## 2016-11-26 DIAGNOSIS — R768 Other specified abnormal immunological findings in serum: Secondary | ICD-10-CM

## 2016-11-26 DIAGNOSIS — M19041 Primary osteoarthritis, right hand: Secondary | ICD-10-CM | POA: Diagnosis not present

## 2016-11-26 DIAGNOSIS — Z8719 Personal history of other diseases of the digestive system: Secondary | ICD-10-CM

## 2016-11-26 DIAGNOSIS — M19071 Primary osteoarthritis, right ankle and foot: Secondary | ICD-10-CM | POA: Diagnosis not present

## 2016-11-26 NOTE — Patient Instructions (Signed)
Office Visit on 10/28/2016  Component Date Value Ref Range Status  . Glucose, Bld 10/28/2016 93  65 - 99 mg/dL Final   Comment: .            Fasting reference interval .   . BUN 10/28/2016 10  7 - 25 mg/dL Final  . Creat 10/28/2016 0.69  0.50 - 1.05 mg/dL Final   Comment: For patients >51 years of age, the reference limit for Creatinine is approximately 13% higher for people identified as African-American. .   . GFR, Est Non African American 10/28/2016 101  > OR = 60 mL/min/1.24m2 Final  . GFR, Est African American 10/28/2016 117  > OR = 60 mL/min/1.26m2 Final  . BUN/Creatinine Ratio 45/40/9811 NOT APPLICABLE  6 - 22 (calc) Final  . Sodium 10/28/2016 138  135 - 146 mmol/L Final  . Potassium 10/28/2016 4.5  3.5 - 5.3 mmol/L Final  . Chloride 10/28/2016 103  98 - 110 mmol/L Final  . CO2 10/28/2016 24  20 - 32 mmol/L Final  . Calcium 10/28/2016 9.7  8.6 - 10.4 mg/dL Final  . Total Protein 10/28/2016 7.0  6.1 - 8.1 g/dL Final  . Albumin 10/28/2016 4.4  3.6 - 5.1 g/dL Final  . Globulin 10/28/2016 2.6  1.9 - 3.7 g/dL (calc) Final  . AG Ratio 10/28/2016 1.7  1.0 - 2.5 (calc) Final  . Total Bilirubin 10/28/2016 0.4  0.2 - 1.2 mg/dL Final  . Alkaline phosphatase (APISO) 10/28/2016 50  33 - 130 U/L Final  . AST 10/28/2016 14  10 - 35 U/L Final  . ALT 10/28/2016 14  6 - 29 U/L Final  . Total CK 10/28/2016 111  29 - 143 U/L Final  . Anit Nuclear Antibody(ANA) 10/28/2016 NEGATIVE  NEGATIVE Final   Comment: ANA IFA is a first line screen for detecting the presence of up to approximately 150 autoantibodies in various autoimmune diseases. A negative ANA IFA result suggests ANA-associated autoimmune diseases are not present at this time. . Visit Physician FAQs for interpretation of all antibodies in the [place], prevalence, and association with diseases at http://education.[facility].com/ BJY/NWG956 .   Marland Kitchen ds DNA Ab 10/28/2016 <1  IU/mL Final   Comment:                             IU/mL       Interpretation                            < or = 4    Negative                            5-9         Indeterminate                            > or = 10   Positive .   Marland Kitchen Scleroderma (Scl-70) (ENA) Antibod* 10/28/2016 2.7 POS* <1.0 NEG AI Final  . SSA (Ro) (ENA) Antibody, IgG 10/28/2016 <1.0 NEG  <1.0 NEG AI Final  . SSB (La) (ENA) Antibody, IgG 10/28/2016 <1.0 NEG  <1.0 NEG AI Final  . Ribonucleic Protein(ENA) Antibody,* 10/28/2016 <1.0 NEG  <1.0 NEG AI Final  . C3 Complement 10/28/2016 153  83 - 193 mg/dL Final  . C4 Complement 10/28/2016 29  15 -  57 mg/dL Final  . Beta-2 Glyco I IgG 10/28/2016 <9  < OR = 20 SGU Final  . Beta-2 Glyco 1 IgM 10/28/2016 <9  < OR = 20 SMU Final  . Beta-2 Glyco 1 IgA 10/28/2016 <9  < OR = 20 SAU Final  . Anticardiolipin IgA 10/28/2016 <11  APL Final   Comment:     Value       Interpretation     -----       --------------     < or = 11   Negative     12 - 20     Indeterminate     21 - 80     Low to Medium Positive     >80         High Positive     . Anticardiolipin IgG 10/28/2016 <14  GPL Final   Comment:     Value       Interpretation     -----       --------------     < or = 14   Negative     15 - 20     Indeterminate     21 - 80     Low to Medium Positive     >80         High Positive   . Anticardiolipin IgM 10/28/2016 <12  MPL Final   Comment:     Value       Interpretation     -----       --------------     < or = 12   Negative     13 - 20     Indeterminate     21 - 80     Low to Medium Positive     >80         High Positive   . Comment 10/28/2016    Final   Comment: . The antiphospholipid antibody syndrome (APS) is a clinical-pathologic correlation that includes a clinical event(e.g. thrombosis, pregnancy loss, thrombocytopenia) and persistent positive antiphospholipid antibodies (IgM or IgG ACA >40 MPL/GPL, IgM or IgG anti-b2GPI antibodies or a lupus anticoagulant). The IgA isotype has been implicated in smaller studies,  but has not yet been incorporated into the APS criteria. International consensus guidelines suggest waiting at least 12 weeks before retesting to confirm antibody persistence. Reference J Thromb Haemost 2006: 4;  295 .   Marland Kitchen Lupus Anticoagulant Eval 10/28/2016 see note   Final   Comment: A Lupus Anticoagulant is not detected. Marland Kitchen Reference Range:  Not Detected . http://education.[facility].com/faq/LupusAnticoag ------------------------------------------------------- . This interpretation is based on the following test results. .   . PTT LA SCREEN 10/28/2016 39  <=40 sec Final  . DRVVT MIX INTERPRETATION: 10/28/2016 Not Indicated   Final  . dRVVT Screen 10/28/2016 41  <=45 sec Final

## 2016-12-11 ENCOUNTER — Encounter (HOSPITAL_COMMUNITY): Payer: Self-pay | Admitting: Emergency Medicine

## 2016-12-11 ENCOUNTER — Ambulatory Visit (HOSPITAL_COMMUNITY)
Admission: EM | Admit: 2016-12-11 | Discharge: 2016-12-11 | Disposition: A | Discharge status: Home / Self Care | Payer: 59 | Attending: Nurse Practitioner | Admitting: Nurse Practitioner

## 2016-12-11 DIAGNOSIS — L0291 Cutaneous abscess, unspecified: Secondary | ICD-10-CM

## 2016-12-11 MED ORDER — SULFAMETHOXAZOLE-TRIMETHOPRIM 800-160 MG PO TABS: 1.0000 | ORAL_TABLET | Freq: Two times a day (BID) | ORAL | 0 refills | Status: AC

## 2016-12-11 NOTE — ED Triage Notes (Signed)
Pt with abscess to chest x several weeks growing in size

## 2016-12-11 NOTE — ED Provider Notes (Signed)
[facility]    CSN: [id number] Arrival date & time: 12/11/16  1859     History   Chief Complaint No chief complaint on file.   HPI Tasharra D Stubblefield is a 51 y.o. female.   Abscess Patient presents for evaluation of a cutaneous abscess. Lesion is located in the upper chest region just above an existing keloid/scar tissue. Onset was 1 week ago. Symptoms have gradually worsened. Abscess has associated symptoms of pain. She denies any spontaneous drainage, fever or chills. Patient does not have previous history of cutaneous abscesses. Patient does not have diabetes.       Past Medical History:  Diagnosis Date  . Acid reflux   . Arthritis   . Headache(784.0)   . Hyperlipidemia   . Vitamin D deficiency disease     Patient Active Problem List   Diagnosis Date Noted  . ANA positive 10/23/2016  . Multiple joint pain 10/23/2016  . History of headache 10/23/2016  . Tobacco use 10/23/2016  . History of gastroesophageal reflux (GERD) 10/23/2016  . History of cholecystectomy 10/23/2016  . Symptomatic cholelithiasis 07/20/2012    Past Surgical History:  Procedure Laterality Date  . CHOLECYSTECTOMY N/A 08/12/2012   Procedure: LAPAROSCOPIC CHOLECYSTECTOMY POSSIBLE IOC;  Surgeon: Harl Bowie, MD;  Location: [facility];  Service: General;  Laterality: N/A;  . TONSILLECTOMY      OB History    No data available       Home Medications    Prior to Admission medications   Medication Sig Start Date End Date Taking? Authorizing Provider  Multiple Vitamin (MULTIVITAMIN WITH MINERALS) TABS Take 1 tablet by mouth daily.    [provider]  Multiple Vitamins-Minerals (HAIR SKIN AND NAILS FORMULA PO) Take by mouth.    [provider]    Family History Family History  Problem Relation Age of Onset  . Diabetes Mother     Social History Social History  Substance Use Topics  . Smoking status: Current Every Day Smoker    Packs/day: 0.25    Years:  10.00    Types: Cigarettes  . Smokeless tobacco: Never Used     Comment: OCC ALCOHOL  . Alcohol use No     Comment: rare     Allergies   Penicillins   Review of Systems Review of Systems  Constitutional: Negative for chills, diaphoresis and fever.  Skin:       Abscess to mid upper chest      Physical Exam Triage Vital Signs ED Triage Vitals  Enc Vitals Group     BP      Pulse      Resp      Temp      Temp src      SpO2      Weight      Height      Head Circumference      Peak Flow      Pain Score      Pain Loc      Pain Edu?      Excl. in [place]?    No data found.   Updated Vital Signs There were no vitals taken for this visit.  Visual Acuity Right Eye Distance:   Left Eye Distance:   Bilateral Distance:    Right Eye Near:   Left Eye Near:    Bilateral Near:     Physical Exam  Constitutional: She is oriented to person, place, and time. She appears well-developed and  well-nourished.  Cardiovascular: Normal rate and regular rhythm.   Pulmonary/Chest: Effort normal and breath sounds normal.  Musculoskeletal: Normal range of motion.  Neurological: She is alert and oriented to person, place, and time.  Skin: Skin is warm and dry.  There is an approximately 3 cm rounded abscess to the mid upper chest with minimal surrounding erythema. No induration.  Psychiatric: She has a normal mood and affect.     UC Treatments / Results  Labs (all labs ordered are listed, but only abnormal results are displayed) Labs Reviewed - No data to display  EKG  EKG Interpretation None       Radiology No results found.  Procedures Procedures (including critical care time)  Medications Ordered in UC Medications - No data to display   Initial Impression / Assessment and Plan / UC Course  I have reviewed the triage vital signs and the nursing notes.  Pertinent labs & imaging results that were available during my care of the patient were reviewed by me and  considered in my medical decision making (see chart for details).    51 year old female presents with a one-week history of worsening abscess of the mid chest. There is mild surrounding erythema but no induration. No indication for I&D at this time. Advised patient to do warm compresses and will empirically start on Bactrim. Strict return precautions advised. Follow-up here or with PCP as needed.   Discussed diagnosis and treatment with patient. All questions have been answered and all concerns have been addressed. The patient verbalized understanding and had no further questions   Final Clinical Impressions(s) / UC Diagnoses   Final diagnoses:  Abscess    New Prescriptions New Prescriptions   No medications on file     Controlled Substance Prescriptions [place] Controlled Substance Registry consulted? Not Applicable   Enrique Sack, [place] 12/11/16 1932

## 2016-12-11 NOTE — Discharge Instructions (Signed)
Your abscess does not need to be drained at this time. Use warm, clean, wet washcloth over the abscess several times a day. Start antibiotics as prescribed. Follow-up with your primary care provider.

## 2017-01-21 DIAGNOSIS — Z01419 Encounter for gynecological examination (general) (routine) without abnormal findings: Secondary | ICD-10-CM | POA: Diagnosis not present

## 2017-01-21 DIAGNOSIS — Z124 Encounter for screening for malignant neoplasm of cervix: Secondary | ICD-10-CM | POA: Diagnosis not present

## 2017-01-22 ENCOUNTER — Other Ambulatory Visit: Payer: Self-pay | Admitting: Nurse Practitioner

## 2017-01-22 DIAGNOSIS — Z1231 Encounter for screening mammogram for malignant neoplasm of breast: Secondary | ICD-10-CM

## 2017-01-27 ENCOUNTER — Ambulatory Visit: Payer: 59

## 2017-02-02 DIAGNOSIS — Z124 Encounter for screening for malignant neoplasm of cervix: Secondary | ICD-10-CM | POA: Diagnosis not present

## 2017-02-02 DIAGNOSIS — Z01419 Encounter for gynecological examination (general) (routine) without abnormal findings: Secondary | ICD-10-CM | POA: Diagnosis not present

## 2017-02-11 DIAGNOSIS — L720 Epidermal cyst: Secondary | ICD-10-CM | POA: Diagnosis not present

## 2017-02-11 DIAGNOSIS — D3612 Benign neoplasm of peripheral nerves and autonomic nervous system, upper limb, including shoulder: Secondary | ICD-10-CM | POA: Diagnosis not present

## 2017-02-18 DIAGNOSIS — N6082 Other benign mammary dysplasias of left breast: Secondary | ICD-10-CM | POA: Diagnosis not present

## 2017-02-25 ENCOUNTER — Ambulatory Visit
Admission: RE | Admit: 2017-02-25 | Discharge: 2017-02-25 | Disposition: A | Discharge status: Home / Self Care | Payer: 59 | Source: Ambulatory Visit | Attending: Nurse Practitioner | Admitting: Nurse Practitioner

## 2017-02-25 DIAGNOSIS — Z1231 Encounter for screening mammogram for malignant neoplasm of breast: Secondary | ICD-10-CM | POA: Diagnosis not present

## 2017-04-09 DIAGNOSIS — N6082 Other benign mammary dysplasias of left breast: Secondary | ICD-10-CM | POA: Diagnosis not present

## 2017-04-09 DIAGNOSIS — L720 Epidermal cyst: Secondary | ICD-10-CM | POA: Diagnosis not present

## 2017-11-12 NOTE — Progress Notes (Deleted)
Office Visit Note  Patient: Jacqueline Baxter             Date of Birth: 04-28-1965           MRN: [id number]             PCP: Minette Brine Referring: Minette Brine, FNP Visit Date: 11/26/2017 Occupation: @GUAROCC @  Subjective:  No chief complaint on file.   History of Present Illness: Jacqueline Baxter is a 52 y.o. female ***   Activities of Daily Living:  Patient reports morning stiffness for *** {minute/hour:19697}.   Patient {ACTIONS;DENIES/REPORTS:21021675::"Denies"} nocturnal pain.  Difficulty dressing/grooming: {ACTIONS;DENIES/REPORTS:21021675::"Denies"} Difficulty climbing stairs: {ACTIONS;DENIES/REPORTS:21021675::"Denies"} Difficulty getting out of chair: {ACTIONS;DENIES/REPORTS:21021675::"Denies"} Difficulty using hands for taps, buttons, cutlery, and/or writing: {ACTIONS;DENIES/REPORTS:21021675::"Denies"}  No Rheumatology ROS completed.   PMFS History:  Patient Active Problem List   Diagnosis Date Noted  . ANA positive 10/23/2016  . Multiple joint pain 10/23/2016  . History of headache 10/23/2016  . Tobacco use 10/23/2016  . History of gastroesophageal reflux (GERD) 10/23/2016  . History of cholecystectomy 10/23/2016  . Symptomatic cholelithiasis 07/20/2012    Past Medical History:  Diagnosis Date  . Acid reflux   . Arthritis   . Headache(784.0)   . Hyperlipidemia   . Vitamin D deficiency disease     Family History  Problem Relation Age of Onset  . Diabetes Mother    Past Surgical History:  Procedure Laterality Date  . BREAST BIOPSY Right 2017  . CHOLECYSTECTOMY N/A 08/12/2012   Procedure: LAPAROSCOPIC CHOLECYSTECTOMY POSSIBLE IOC;  Surgeon: Harl Bowie, MD;  Location: [facility];  Service: General;  Laterality: N/A;  . TONSILLECTOMY     Social History   Social History Narrative  . Not on file    Objective: Vital Signs: There were no vitals taken for this visit.   Physical Exam   Musculoskeletal Exam: ***  CDAI Exam: CDAI Score: Not  documented Patient Global Assessment: Not documented; Provider Global Assessment: Not documented Swollen: Not documented; Tender: Not documented Joint Exam   Not documented   There is currently no information documented on the homunculus. Go to the Rheumatology activity and complete the homunculus joint exam.  Investigation: No additional findings.  Imaging: No results found.  Recent Labs: Lab Results  Component Value Date   WBC 7.1 08/06/2012   HGB 12.7 08/06/2012   PLT 280 08/06/2012   NA 138 10/28/2016   K 4.5 10/28/2016   CL 103 10/28/2016   CO2 24 10/28/2016   GLUCOSE 93 10/28/2016   BUN 10 10/28/2016   CREATININE 0.69 10/28/2016   BILITOT 0.4 10/28/2016   ALKPHOS 50 08/06/2012   AST 14 10/28/2016   ALT 14 10/28/2016   PROT 7.0 10/28/2016   ALBUMIN 3.6 08/06/2012   CALCIUM 9.7 10/28/2016   GFRAA 117 10/28/2016    Speciality Comments: No specialty comments available.  Procedures:  No procedures performed Allergies: Penicillins   Assessment / Plan:     Visit Diagnoses: ANA positive  Primary osteoarthritis of both hands  Primary osteoarthritis of both feet  History of headache  History of gastroesophageal reflux (GERD)  Tobacco use  History of cholecystectomy   Orders: No orders of the defined types were placed in this encounter.  No orders of the defined types were placed in this encounter.   Face-to-face time spent with patient was *** minutes. Greater than 50% of time was spent in counseling and coordination of care.  Follow-Up Instructions: No follow-ups on file.  Ofilia Neas, PA-C  Note - This record has been created using Dragon software.  Chart creation errors have been sought, but may not always  have been located. Such creation errors do not reflect on  the standard of medical care.

## 2017-11-26 ENCOUNTER — Ambulatory Visit: Payer: 59 | Admitting: Rheumatology

## 2017-11-26 ENCOUNTER — Ambulatory Visit: Payer: 59 | Admitting: Physician Assistant

## 2017-12-08 NOTE — Progress Notes (Deleted)
Office Visit Note  Patient: Jacqueline Baxter             Date of Birth: Dec 19, 1965           MRN: [id number]             PCP: Minette Brine, FNP Referring: Minette Brine, FNP Visit Date: 12/22/2017 Occupation: @GUAROCC @  Subjective:  No chief complaint on file.   History of Present Illness: Jacqueline Baxter is a 52 y.o. female ***   Activities of Daily Living:  Patient reports morning stiffness for *** {minute/hour:19697}.   Patient {ACTIONS;DENIES/REPORTS:21021675::"Denies"} nocturnal pain.  Difficulty dressing/grooming: {ACTIONS;DENIES/REPORTS:21021675::"Denies"} Difficulty climbing stairs: {ACTIONS;DENIES/REPORTS:21021675::"Denies"} Difficulty getting out of chair: {ACTIONS;DENIES/REPORTS:21021675::"Denies"} Difficulty using hands for taps, buttons, cutlery, and/or writing: {ACTIONS;DENIES/REPORTS:21021675::"Denies"}  No Rheumatology ROS completed.   PMFS History:  Patient Active Problem List   Diagnosis Date Noted  . ANA positive 10/23/2016  . Multiple joint pain 10/23/2016  . History of headache 10/23/2016  . Tobacco use 10/23/2016  . History of gastroesophageal reflux (GERD) 10/23/2016  . History of cholecystectomy 10/23/2016  . Symptomatic cholelithiasis 07/20/2012    Past Medical History:  Diagnosis Date  . Acid reflux   . Arthritis   . Headache(784.0)   . Hyperlipidemia   . Vitamin D deficiency disease     Family History  Problem Relation Age of Onset  . Diabetes Mother    Past Surgical History:  Procedure Laterality Date  . BREAST BIOPSY Right 2017  . CHOLECYSTECTOMY N/A 08/12/2012   Procedure: LAPAROSCOPIC CHOLECYSTECTOMY POSSIBLE IOC;  Surgeon: Harl Bowie, MD;  Location: [facility];  Service: General;  Laterality: N/A;  . TONSILLECTOMY     Social History   Social History Narrative  . Not on file    Objective: Vital Signs: There were no vitals taken for this visit.   Physical Exam   Musculoskeletal Exam: ***  CDAI Exam: CDAI Score: Not  documented Patient Global Assessment: Not documented; Provider Global Assessment: Not documented Swollen: Not documented; Tender: Not documented Joint Exam   Not documented   There is currently no information documented on the homunculus. Go to the Rheumatology activity and complete the homunculus joint exam.  Investigation: No additional findings.  Imaging: No results found.  Recent Labs: Lab Results  Component Value Date   WBC 7.1 08/06/2012   HGB 12.7 08/06/2012   PLT 280 08/06/2012   NA 138 10/28/2016   K 4.5 10/28/2016   CL 103 10/28/2016   CO2 24 10/28/2016   GLUCOSE 93 10/28/2016   BUN 10 10/28/2016   CREATININE 0.69 10/28/2016   BILITOT 0.4 10/28/2016   ALKPHOS 50 08/06/2012   AST 14 10/28/2016   ALT 14 10/28/2016   PROT 7.0 10/28/2016   ALBUMIN 3.6 08/06/2012   CALCIUM 9.7 10/28/2016   GFRAA 117 10/28/2016    Speciality Comments: No specialty comments available.  Procedures:  No procedures performed Allergies: Penicillins   Assessment / Plan:     Visit Diagnoses: ANA positive - Positive ANA in the past negative normal, SCL 70 positive. History of fatigue, intermittent joint swelling and arthralgias. no clinical features of scleroderma  Primary osteoarthritis of both hands  Primary osteoarthritis of both feet  History of headache  History of gastroesophageal reflux (GERD)  Tobacco use  History of cholecystectomy   Orders: No orders of the defined types were placed in this encounter.  No orders of the defined types were placed in this encounter.   Face-to-face time spent with patient  was *** minutes. Greater than 50% of time was spent in counseling and coordination of care.  Follow-Up Instructions: No follow-ups on file.   Ofilia Neas, PA-C  Note - This record has been created using Dragon software.  Chart creation errors have been sought, but may not always  have been located. Such creation errors do not reflect on  the standard of  medical care.

## 2017-12-22 ENCOUNTER — Ambulatory Visit: Payer: 59 | Admitting: Physician Assistant

## 2018-01-13 ENCOUNTER — Ambulatory Visit: Payer: 59 | Admitting: Nurse Practitioner

## 2018-01-13 ENCOUNTER — Encounter: Payer: Self-pay | Admitting: Nurse Practitioner

## 2018-01-13 VITALS — BP 124/82 | HR 85 | Temp 98.0°F | Ht 61.0 in | Wt 183.2 lb

## 2018-01-13 DIAGNOSIS — E559 Vitamin D deficiency, unspecified: Secondary | ICD-10-CM | POA: Diagnosis not present

## 2018-01-13 DIAGNOSIS — R7309 Other abnormal glucose: Secondary | ICD-10-CM

## 2018-01-13 DIAGNOSIS — Z139 Encounter for screening, unspecified: Secondary | ICD-10-CM

## 2018-01-13 DIAGNOSIS — Z23 Encounter for immunization: Secondary | ICD-10-CM | POA: Diagnosis not present

## 2018-01-13 DIAGNOSIS — R252 Cramp and spasm: Secondary | ICD-10-CM

## 2018-01-13 DIAGNOSIS — Z1211 Encounter for screening for malignant neoplasm of colon: Secondary | ICD-10-CM | POA: Diagnosis not present

## 2018-01-13 DIAGNOSIS — R2232 Localized swelling, mass and lump, left upper limb: Secondary | ICD-10-CM

## 2018-01-13 DIAGNOSIS — Z0000 Encounter for general adult medical examination without abnormal findings: Secondary | ICD-10-CM

## 2018-01-13 LAB — POCT URINALYSIS DIPSTICK
BILIRUBIN UA: NEGATIVE
Blood, UA: NEGATIVE
GLUCOSE UA: NEGATIVE
KETONES UA: NEGATIVE
Leukocytes, UA: NEGATIVE
Nitrite, UA: NEGATIVE
Protein, UA: NEGATIVE
Spec Grav, UA: 1.025 (ref 1.010–1.025)
UROBILINOGEN UA: 0.2 U/dL
pH, UA: 7 (ref 5.0–8.0)

## 2018-01-13 NOTE — Patient Instructions (Signed)

## 2018-01-13 NOTE — Progress Notes (Addendum)
Subjective:     Patient ID: Jacqueline Baxter , female    DOB: 11/09/1965 , 52 y.o.   MRN: [id number]   Chief Complaint  Patient presents with  . Annual Exam   The patient states she uses IUD for birth control. Last LMP was No LMP recorded. (Menstrual status: IUD).. Negative for Dysmenorrhea and Negative for Menorrhagia Mammogram last done  Negative for: breast discharge, breast lump(s), breast pain and breast self exam.  Pertinent negatives include abnormal bleeding (hematology), anxiety, decreased libido, depression, difficulty falling sleep, dyspareunia, history of infertility, nocturia, sexual dysfunction, sleep disturbances, urinary incontinence, urinary urgency, vaginal discharge and vaginal itching. Diet: cutting back on her carbohydrates, sugar and salt.The patient states her exercise level is  rarely   . The patient's tobacco use is:  Social History   Tobacco Use  Smoking Status Current Every Day Smoker  . Packs/day: 0.25  . Years: 10.00  . Pack years: 2.50  . Types: Cigarettes  Smokeless Tobacco Never Used  Tobacco Comment   OCC ALCOHOL  . She has been exposed to passive smoke. The patient's alcohol use is:  Social History   Substance and Sexual Activity  Alcohol Use No   Comment: rare  . Additional information: Last pap 2017, next one scheduled for 2019.   HPI  Here for health maintenance    Past Medical History:  Diagnosis Date  . Acid reflux   . Arthritis   . Headache(784.0)   . Hyperlipidemia   . Vitamin D deficiency disease      Family History  Problem Relation Age of Onset  . Diabetes Mother      Current Outpatient Medications:  Marland Kitchen  Multiple Vitamin (MULTIVITAMIN WITH MINERALS) TABS, Take 1 tablet by mouth daily., Disp: , Rfl:    Allergies  Allergen Reactions  . Penicillins Other (See Comments)    Unknown childhood reaction     Review of Systems  Constitutional: Negative.   HENT: Negative.   Eyes: Negative.   Respiratory: Negative.    Cardiovascular: Negative.   Gastrointestinal: Negative.   Endocrine: Negative.   Genitourinary: Negative.   Musculoskeletal: Negative.   Skin: Negative.   Allergic/Immunologic: Negative.   Neurological: Negative.   Hematological: Negative.   Psychiatric/Behavioral: Negative.      Today's Vitals   01/13/18 1419  BP: 124/82  Pulse: 85  Temp: 98 F (36.7 C)  TempSrc: Oral  SpO2: 98%  Weight: 183 lb 3.2 oz (83.1 kg)  Height: 5\' 1"  (1.549 m)  PainSc: 0-No pain   Body mass index is 34.62 kg/m.   Objective:  Physical Exam  Constitutional: She is oriented to person, place, and time. Vital signs are normal. She appears well-developed and well-nourished.  HENT:  Head: Normocephalic and atraumatic.  Right Ear: Hearing, tympanic membrane, external ear and ear canal normal.  Left Ear: Hearing, tympanic membrane, external ear and ear canal normal.  Nose: Nose normal.  Mouth/Throat: Oropharynx is clear and moist.  Eyes: Pupils are equal, round, and reactive to light. Conjunctivae, EOM and lids are normal.  Fundoscopic exam:      The right eye shows no papilledema.       The left eye shows no papilledema.  Neck: Normal range of motion and full passive range of motion without pain. Neck supple. Carotid bruit is not present. No thyroid mass present.  Cardiovascular: Normal rate, regular rhythm, normal heart sounds, intact distal pulses and normal pulses.  No murmur heard. Pulmonary/Chest: Effort normal and breath  sounds normal. She exhibits no tenderness.  Abdominal: Soft. Bowel sounds are normal. She exhibits no distension and no mass. There is no tenderness.  Musculoskeletal: Normal range of motion.  Bilateral leg cramps   Neurological: She is alert and oriented to person, place, and time. She has normal strength. No cranial nerve deficit or sensory deficit.  Skin: Skin is warm and dry. Capillary refill takes less than 2 seconds.  Nodule to right wrist inner   Psychiatric: She  has a normal mood and affect. Her behavior is normal. Judgment and thought content normal.  Vitals reviewed.       Assessment And Plan:     1. Routine general medical examination at a health care facility . Behavior modifications discussed and diet history reviewed.   . Pt will continue to exercise regularly and modify diet with low GI, plant based foods and decrease intake of processed foods.  . Recommend intake of daily multivitamin, Vitamin D, and calcium.  . Recommend mammogram and colonoscopy for preventive screenings, as well as recommend immunizations that include influenza, TDAP, and Shingles  - CBC no Diff - CMP14 + Anion Gap - Hemoglobin A1c  2. Need for influenza vaccination  Influenza vaccine administered  Encouraged to take Tylenol as needed for fever or muscle aches. - Flu Vaccine QUAD 6+ mos PF IM (Fluarix Quad PF)  3. Encounter for screening colonoscopy  According to USPTF Colorectal cancer Screening guidelines. Colonoscopy is recommended every 10 years, starting at age 4years.  Will refer to GI for colon cancer screening.  - Ambulatory referral to Gastroenterology  4. Encounter for screening  - HIV antibody  5. Vitamin D deficiency  Discussed importance of taking vitamin d regularly - Vitamin D (25 hydroxy)  6. Abnormal glucose  Chronic, controlled  Continue with current medications  Encouraged to limit intake of sugary foods and drinks  Encouraged to increase physical activity to 150 minutes per week  7. Leg cramps  Encouraged to stay well hydrated and to take a magnesium supplement  8. Lump of left wrist  Firm lump to medial wrist likely cyst however confirmation with ultrasound yet at this time will continue to monitor.     Minette Brine, FNP  Patient ID: Jacqueline Baxter, female   DOB: January 07, 1966, 52 y.o.   MRN: [id number]

## 2018-01-14 LAB — HEMOGLOBIN A1C
ESTIMATED AVERAGE GLUCOSE: 128 mg/dL
Hgb A1c MFr Bld: 6.1 % — ABNORMAL HIGH (ref 4.8–5.6)

## 2018-01-14 LAB — CMP14 + ANION GAP
A/G RATIO: 1.9 (ref 1.2–2.2)
ALK PHOS: 46 IU/L (ref 39–117)
ALT: 12 IU/L (ref 0–32)
AST: 12 IU/L (ref 0–40)
Albumin: 4.2 g/dL (ref 3.5–5.5)
Anion Gap: 15 mmol/L (ref 10.0–18.0)
BUN/Creatinine Ratio: 9 (ref 9–23)
BUN: 7 mg/dL (ref 6–24)
CO2: 25 mmol/L (ref 20–29)
CREATININE: 0.76 mg/dL (ref 0.57–1.00)
Calcium: 8.9 mg/dL (ref 8.7–10.2)
Chloride: 104 mmol/L (ref 96–106)
GFR, EST AFRICAN AMERICAN: 104 mL/min/{1.73_m2} (ref >59)
GFR, EST NON AFRICAN AMERICAN: 90 mL/min/{1.73_m2} (ref >59)
GLOBULIN, TOTAL: 2.2 g/dL (ref 1.5–4.5)
GLUCOSE: 82 mg/dL (ref 65–99)
Potassium: 4.7 mmol/L (ref 3.5–5.2)
Sodium: 144 mmol/L (ref 134–144)
Total Protein: 6.4 g/dL (ref 6.0–8.5)

## 2018-01-14 LAB — CBC
Hematocrit: 37.1 % (ref 34.0–46.6)
Hemoglobin: 12.2 g/dL (ref 11.1–15.9)
MCH: 27 pg (ref 26.6–33.0)
MCHC: 32.9 g/dL (ref 31.5–35.7)
MCV: 82 fL (ref 79–97)
PLATELETS: 316 10*3/uL (ref 150–450)
RBC: 4.52 x10E6/uL (ref 3.77–5.28)
RDW: 13.3 % (ref 12.3–15.4)
WBC: 9.6 10*3/uL (ref 3.4–10.8)

## 2018-01-14 LAB — HIV ANTIBODY (ROUTINE TESTING W REFLEX): HIV Screen 4th Generation wRfx: NONREACTIVE

## 2018-01-14 LAB — VITAMIN D 25 HYDROXY (VIT D DEFICIENCY, FRACTURES): Vit D, 25-Hydroxy: 12.6 ng/mL — ABNORMAL LOW (ref 30.0–100.0)

## 2018-01-18 ENCOUNTER — Other Ambulatory Visit: Payer: Self-pay | Admitting: Nurse Practitioner

## 2018-01-18 DIAGNOSIS — E559 Vitamin D deficiency, unspecified: Secondary | ICD-10-CM

## 2018-01-18 MED ORDER — VITAMIN D (ERGOCALCIFEROL) 1.25 MG (50000 UNIT) PO CAPS: 50000.0000 [IU] | ORAL_CAPSULE | ORAL | 1 refills | Status: DC

## 2018-02-09 ENCOUNTER — Encounter: Payer: Self-pay | Admitting: Nurse Practitioner

## 2018-02-16 ENCOUNTER — Telehealth: Payer: Self-pay

## 2018-02-16 NOTE — Telephone Encounter (Signed)
Returned pt call and advised the the vitamin d prescription was sent on 01/18/18 with an additional refill so she should be able to pick it up. YRL,RMA

## 2018-06-17 DIAGNOSIS — R140 Abdominal distension (gaseous): Secondary | ICD-10-CM | POA: Diagnosis not present

## 2018-06-17 DIAGNOSIS — R635 Abnormal weight gain: Secondary | ICD-10-CM | POA: Diagnosis not present

## 2018-06-17 DIAGNOSIS — K219 Gastro-esophageal reflux disease without esophagitis: Secondary | ICD-10-CM | POA: Diagnosis not present

## 2018-09-02 ENCOUNTER — Encounter: Payer: Self-pay | Admitting: Nurse Practitioner

## 2018-09-02 ENCOUNTER — Other Ambulatory Visit: Payer: Self-pay

## 2018-09-02 ENCOUNTER — Ambulatory Visit: Payer: 59 | Admitting: Nurse Practitioner

## 2018-09-02 VITALS — BP 110/70 | HR 86 | Temp 98.7°F | Ht 63.0 in | Wt 184.4 lb

## 2018-09-02 DIAGNOSIS — Z716 Tobacco abuse counseling: Secondary | ICD-10-CM | POA: Diagnosis not present

## 2018-09-02 DIAGNOSIS — M5441 Lumbago with sciatica, right side: Secondary | ICD-10-CM | POA: Diagnosis not present

## 2018-09-02 MED ORDER — CHANTIX STARTING MONTH PAK 0.5 MG X 11 & 1 MG X 42 PO TABS: ORAL_TABLET | ORAL | 0 refills | Status: DC

## 2018-09-02 NOTE — Patient Instructions (Signed)
Sciatica  Sciatica is pain, numbness, weakness, or tingling along the path of the sciatic nerve. The sciatic nerve starts in the lower back and runs down the back of each leg. The nerve controls the muscles in the lower leg and in the back of the knee. It also provides feeling (sensation) to the back of the thigh, the lower leg, and the sole of the foot. Sciatica is a symptom of another medical condition that pinches or puts pressure on the sciatic nerve. Sciatica most often only affects one side of the body. Sciatica usually goes away on its own or with treatment. In some cases, sciatica may come back (recur). What are the causes? This condition is caused by pressure on the sciatic nerve or pinching of the nerve. This may be the result of:  A disk in between the bones of the spine bulging out too far (herniated disk).  Age-related changes in the spinal disks.  A pain disorder that affects a muscle in the buttock.  Extra bone growth near the sciatic nerve.  A break (fracture) of the pelvis.  Pregnancy.  Tumor. This is rare. What increases the risk? The following factors may make you more likely to develop this condition:  Playing sports that place pressure or stress on the spine.  Having poor strength and flexibility.  A history of back injury or surgery.  Sitting for long periods of time.  Doing activities that involve repetitive bending or lifting.  Obesity. What are the signs or symptoms? Symptoms can vary from mild to very severe, and they may include:  Any of these problems in the lower back, leg, hip, or buttock: ? Mild tingling, numbness, or dull aches. ? Burning sensations. ? Sharp pains.  Numbness in the back of the calf or the sole of the foot.  Leg weakness.  Severe back pain that makes movement difficult. Symptoms may get worse when you cough, sneeze, or laugh, or when you sit or stand for long periods of time. How is this diagnosed? This condition may be  diagnosed based on:  Your symptoms and medical history.  A physical exam.  Blood tests.  Imaging tests, such as: ? X-rays. ? MRI. ? CT scan. How is this treated? In many cases, this condition improves on its own without treatment. However, treatment may include:  Reducing or modifying physical activity.  Exercising and stretching.  Icing and applying heat to the affected area.  Medicines that help to: ? Relieve pain and swelling. ? Relax your muscles.  Injections of medicines that help to relieve pain, irritation, and inflammation around the sciatic nerve (steroids).  Surgery. Follow these instructions at home: Medicines  Take over-the-counter and prescription medicines only as told by your health care provider.  Ask your health care provider if the medicine prescribed to you: ? Requires you to avoid driving or using heavy machinery. ? Can cause constipation. You may need to take these actions to prevent or treat constipation:  Drink enough fluid to keep your urine pale yellow.  Take over-the-counter or prescription medicines.  Eat foods that are high in fiber, such as beans, whole grains, and fresh fruits and vegetables.  Limit foods that are high in fat and processed sugars, such as fried or sweet foods. Managing pain      If directed, put ice on the affected area. ? Put ice in a plastic bag. ? Place a towel between your skin and the bag. ? Leave the ice on for 20 minutes,  2-3 times a day.  If directed, apply heat to the affected area. Use the heat source that your health care provider recommends, such as a moist heat pack or a heating pad. ? Place a towel between your skin and the heat source. ? Leave the heat on for 20-30 minutes. ? Remove the heat if your skin turns bright red. This is especially important if you are unable to feel pain, heat, or cold. You may have a greater risk of getting burned. Activity   Return to your normal activities as told  by your health care provider. Ask your health care provider what activities are safe for you.  Avoid activities that make your symptoms worse.  Take brief periods of rest throughout the day. ? When you rest for longer periods, mix in some mild activity or stretching between periods of rest. This will help to prevent stiffness and pain. ? Avoid sitting for long periods of time without moving. Get up and move around at least one time each hour.  Exercise and stretch regularly, as told by your health care provider.  Do not lift anything that is heavier than 10 lb (4.5 kg) while you have symptoms of sciatica. When you do not have symptoms, you should still avoid heavy lifting, especially repetitive heavy lifting.  When you lift objects, always use proper lifting technique, which includes: ? Bending your knees. ? Keeping the load close to your body. ? Avoiding twisting. General instructions  Maintain a healthy weight. Excess weight puts extra stress on your back.  Wear supportive, comfortable shoes. Avoid wearing high heels.  Avoid sleeping on a mattress that is too soft or too hard. A mattress that is firm enough to support your back when you sleep may help to reduce your pain.  Keep all follow-up visits as told by your health care provider. This is important. Contact a health care provider if:  You have pain that: ? Wakes you up when you are sleeping. ? Gets worse when you lie down. ? Is worse than you have experienced in the past. ? Lasts longer than 4 weeks.  You have an unexplained weight loss. Get help right away if:  You are not able to control when you urinate or have bowel movements (incontinence).  You have: ? Weakness in your lower back, pelvis, buttocks, or legs that gets worse. ? Redness or swelling of your back. ? A burning sensation when you urinate. Summary  Sciatica is pain, numbness, weakness, or tingling along the path of the sciatic nerve.  This condition  is caused by pressure on the sciatic nerve or pinching of the nerve.  Sciatica can cause pain, numbness, or tingling in the lower back, legs, hips, and buttocks.  Treatment often includes rest, exercise, medicines, and applying ice or heat. This information is not intended to replace advice given to you by your health care provider. Make sure you discuss any questions you have with your health care provider. Document Released: 01/28/2001 Document Revised: 02/22/2018 Document Reviewed: 02/22/2018 Elsevier Patient Education  Jacqueline Baxter your health care provider which exercises are safe for you. Do exercises exactly as told by your health care provider and adjust them as directed. It is normal to feel mild stretching, pulling, tightness, or discomfort as you do these exercises. Stop right away if you feel sudden pain or your pain gets worse. Do not begin these exercises until told by your health care provider. Stretching and range-of-motion exercises These  exercises warm up your muscles and joints and improve the movement and flexibility of your hips and back. These exercises also help to relieve pain, numbness, and tingling. Sciatic nerve glide 1. Sit in a chair with your head facing down toward your chest. Place your hands behind your back. Let your shoulders slump forward. 2. Slowly straighten one of your legs while you tilt your head back as if you are looking toward the ceiling. Only straighten your leg as far as you can without making your symptoms worse. 3. Hold this position for __________ seconds. 4. Slowly return to the starting position. 5. Repeat with your other leg. Repeat __________ times. Complete this exercise __________ times a day. Knee to chest with hip adduction and internal rotation  1. Lie on your back on a firm surface with both legs straight. 2. Bend one of your knees and move it up toward your chest until you feel a gentle stretch in your lower  back and buttock. Then, move your knee toward the shoulder that is on the opposite side from your leg. This is hip adduction and internal rotation. ? Hold your leg in this position by holding on to the front of your knee. 3. Hold this position for __________ seconds. 4. Slowly return to the starting position. 5. Repeat with your other leg. Repeat __________ times. Complete this exercise __________ times a day. Prone extension on elbows  1. Lie on your abdomen on a firm surface. A bed may be too soft for this exercise. 2. Prop yourself up on your elbows. 3. Use your arms to help lift your chest up until you feel a gentle stretch in your abdomen and your lower back. ? This will place some of your body weight on your elbows. If this is uncomfortable, try stacking pillows under your chest. ? Your hips should stay down, against the surface that you are lying on. Keep your hip and back muscles relaxed. 4. Hold this position for __________ seconds. 5. Slowly relax your upper body and return to the starting position. Repeat __________ times. Complete this exercise __________ times a day. Strengthening exercises These exercises build strength and endurance in your back. Endurance is the ability to use your muscles for a long time, even after they get tired. Pelvic tilt This exercise strengthens the muscles that lie deep in the abdomen. 1. Lie on your back on a firm surface. Bend your knees and keep your feet flat on the floor. 2. Tense your abdominal muscles. Tip your pelvis up toward the ceiling and flatten your lower back into the floor. ? To help with this exercise, you may place a small towel under your lower back and try to push your back into the towel. 3. Hold this position for __________ seconds. 4. Let your muscles relax completely before you repeat this exercise. Repeat __________ times. Complete this exercise __________ times a day. Alternating arm and leg raises  1. Get on your hands  and knees on a firm surface. If you are on a hard floor, you may want to use padding, such as an exercise mat, to cushion your knees. 2. Line up your arms and legs. Your hands should be directly below your shoulders, and your knees should be directly below your hips. 3. Lift your left leg behind you. At the same time, raise your right arm and straighten it in front of you. ? Do not lift your leg higher than your hip. ? Do not lift your arm higher  than your shoulder. ? Keep your abdominal and back muscles tight. ? Keep your hips facing the ground. ? Do not arch your back. ? Keep your balance carefully, and do not hold your breath. 4. Hold this position for __________ seconds. 5. Slowly return to the starting position. 6. Repeat with your right leg and your left arm. Repeat __________ times. Complete this exercise __________ times a day. Posture and body mechanics Good posture and healthy body mechanics can help to relieve stress in your body's tissues and joints. Body mechanics refers to the movements and positions of your body while you do your daily activities. Posture is part of body mechanics. Good posture means:  Your spine is in its natural S-curve position (neutral).  Your shoulders are pulled back slightly.  Your head is not tipped forward. Follow these guidelines to improve your posture and body mechanics in your everyday activities. Standing   When standing, keep your spine neutral and your feet about hip width apart. Keep a slight bend in your knees. Your ears, shoulders, and hips should line up.  When you do a task in which you stand in one place for a long time, place one foot up on a stable object that is 2-4 inches (5-10 cm) high, such as a footstool. This helps keep your spine neutral. Sitting   When sitting, keep your spine neutral and keep your feet flat on the floor. Use a footrest, if necessary, and keep your thighs parallel to the floor. Avoid rounding your  shoulders, and avoid tilting your head forward.  When working at a desk or a computer, keep your desk at a height where your hands are slightly lower than your elbows. Slide your chair under your desk so you are close enough to maintain good posture.  When working at a computer, place your monitor at a height where you are looking straight ahead and you do not have to tilt your head forward or downward to look at the screen. Resting  When lying down and resting, avoid positions that are most painful for you.  If you have pain with activities such as sitting, bending, stooping, or squatting, lie in a position in which your body does not bend very much. For example, avoid curling up on your side with your arms and knees near your chest (fetal position).  If you have pain with activities such as standing for a long time or reaching with your arms, lie with your spine in a neutral position and bend your knees slightly. Try the following positions: ? Lying on your side with a pillow between your knees. ? Lying on your back with a pillow under your knees. Lifting   When lifting objects, keep your feet at least shoulder width apart and tighten your abdominal muscles.  Bend your knees and hips and keep your spine neutral. It is important to lift using the strength of your legs, not your back. Do not lock your knees straight out.  Always ask for help to lift heavy or awkward objects. This information is not intended to replace advice given to you by your health care provider. Make sure you discuss any questions you have with your health care provider. Document Released: 02/03/2005 Document Revised: 05/28/2018 Document Reviewed: 02/25/2018 Elsevier Patient Education  2020 Jacqueline Baxter can use Voltaren gel to the low back area if has pain again.

## 2018-09-02 NOTE — Progress Notes (Signed)
Subjective:     Patient ID: Jacqueline Baxter , female    DOB: January 31, 1966 , 53 y.o.   MRN: [id number]   Chief Complaint  Patient presents with  . Back Pain    patient stated her back has been hurting for the last 2 weeks. patient feelsl ike there is pens and needles in her right leg and she has numbness in her buttock. any movement her lower back hurts a lot.    HPI  Back Pain This is a new problem. The current episode started 1 to 4 weeks ago. The problem has been gradually improving since onset. Pain location: right lower back. Quality: numbness and tingling at times. The pain is at a severity of 1/10. The pain is the same all the time. Exacerbated by: sleeping at night and awakening in the morning or sitting still for a period of time. Pertinent negatives include no chest pain, headaches or pelvic pain.     Past Medical History:  Diagnosis Date  . Acid reflux   . Arthritis   . Headache(784.0)   . Hyperlipidemia   . Vitamin D deficiency disease      Family History  Problem Relation Age of Onset  . Diabetes Mother      Current Outpatient Medications:  Marland Kitchen  Multiple Vitamin (MULTIVITAMIN WITH MINERALS) TABS, Take 1 tablet by mouth daily., Disp: , Rfl:  .  omeprazole (PRILOSEC) 40 MG capsule, Take 40 mg by mouth daily., Disp: , Rfl:  .  Probiotic Product (PROBIOTIC DAILY PO), Take 1 capsule by mouth daily at 12 noon., Disp: , Rfl:  .  Vitamin D, Ergocalciferol, (DRISDOL) 1.25 MG (50000 UT) CAPS capsule, Take 1 capsule (50,000 Units total) by mouth 2 (two) times a week., Disp: 24 capsule, Rfl: 1   Allergies  Allergen Reactions  . Penicillins Other (See Comments)    Unknown childhood reaction     Review of Systems  Constitutional: Negative.   Respiratory: Negative.   Cardiovascular: Negative.  Negative for chest pain, palpitations and leg swelling.  Genitourinary: Negative for pelvic pain.  Musculoskeletal: Positive for back pain.  Neurological: Negative for dizziness and  headaches.     Today's Vitals   09/02/18 1500  BP: 110/70  Pulse: 86  Temp: 98.7 F (37.1 C)  TempSrc: Oral  Weight: 184 lb 6.4 oz (83.6 kg)  Height: 5\' 3"  (1.6 m)  PainSc: 2   PainLoc: Back   Body mass index is 32.66 kg/m.   Objective:  Physical Exam Constitutional:      Appearance: Normal appearance.  Cardiovascular:     Rate and Rhythm: Normal rate and regular rhythm.     Pulses: Normal pulses.     Heart sounds: Normal heart sounds. No murmur.  Skin:    General: Skin is warm and dry.     Capillary Refill: Capillary refill takes less than 2 seconds.  Neurological:     General: No focal deficit present.     Mental Status: She is alert and oriented to person, place, and time.         Assessment And Plan:     1. Acute right-sided low back pain with right-sided sciatica  Encouraged stretching and using a heating pad  If worsens can consider prednisone taper  2. Tobacco abuse counseling Ready to quit: Yes Counseling given: Yes Comment: OCC ALCOHOL Smoking cessation instruction/counseling given:  commended patient for quitting and reviewed strategies for preventing relapses  Will start her on Chantix, discussed side  effects to include nightmares.   - varenicline (CHANTIX STARTING MONTH PAK) 0.5 MG X 11 & 1 MG X 42 tablet; Take one 0.5 mg tablet by mouth once daily for 3 days, then increase to one 0.5 mg tablet twice daily for 4 days, then increase to one 1 mg tablet twice daily.  Dispense: 53 tablet; Refill: 0   Minette Brine, FNP    THE PATIENT IS ENCOURAGED TO PRACTICE SOCIAL DISTANCING DUE TO THE COVID-19 PANDEMIC.

## 2018-11-04 ENCOUNTER — Encounter: Payer: Self-pay | Admitting: Nurse Practitioner

## 2018-11-17 DIAGNOSIS — R911 Solitary pulmonary nodule: Secondary | ICD-10-CM | POA: Insufficient documentation

## 2018-11-22 ENCOUNTER — Other Ambulatory Visit: Payer: Self-pay

## 2018-11-22 ENCOUNTER — Encounter: Payer: Self-pay | Admitting: Nurse Practitioner

## 2018-11-22 ENCOUNTER — Ambulatory Visit (INDEPENDENT_AMBULATORY_CARE_PROVIDER_SITE_OTHER): Payer: 59 | Admitting: Nurse Practitioner

## 2018-11-22 VITALS — BP 118/74 | HR 74 | Temp 99.0°F

## 2018-11-22 DIAGNOSIS — R509 Fever, unspecified: Secondary | ICD-10-CM

## 2018-11-22 DIAGNOSIS — K029 Dental caries, unspecified: Secondary | ICD-10-CM | POA: Diagnosis not present

## 2018-11-22 DIAGNOSIS — R221 Localized swelling, mass and lump, neck: Secondary | ICD-10-CM | POA: Diagnosis not present

## 2018-11-22 DIAGNOSIS — Z1211 Encounter for screening for malignant neoplasm of colon: Secondary | ICD-10-CM | POA: Diagnosis not present

## 2018-11-22 DIAGNOSIS — Z720 Tobacco use: Secondary | ICD-10-CM

## 2018-11-22 MED ORDER — VARENICLINE TARTRATE 1 MG PO TABS: 1.0000 mg | ORAL_TABLET | Freq: Two times a day (BID) | ORAL | 2 refills | Status: DC

## 2018-11-22 MED ORDER — CLINDAMYCIN HCL 300 MG PO CAPS: 300.0000 mg | ORAL_CAPSULE | Freq: Three times a day (TID) | ORAL | 0 refills | Status: DC

## 2018-11-22 NOTE — Progress Notes (Addendum)
Subjective:     Patient ID: Jacqueline Baxter , female    DOB: 1966/01/15 , 53 y.o.   MRN: [id number]   Chief Complaint  Patient presents with  . Follow-up    patient stated she went to the urgent care last wednesday and thursday and was told she had some inflammation in her neck.  . med check    patient is also being seen for a med check on chantix. she stated it has been helping her she is not smoking as much    HPI  She went to Urgent Care twice last week did chest xray and ct scan. Chest xray showed a nodule to watch.  She went to [facility] [place]. She is a smoker.  Inflammation. She is having an issue with her tooth.  She has not been to the dentist for this as well.  A broken tooth.    No additional medications.  She has taken medications for headaches.    She continues to take chantix - has not been taking as regularly - she is smoking 6-7 cigarettes per day.    She is seen outside today due to having a low grade fever and with the current pandemic    Past Medical History:  Diagnosis Date  . Acid reflux   . Arthritis   . Headache(784.0)   . Hyperlipidemia   . Vitamin D deficiency disease      Family History  Problem Relation Age of Onset  . Diabetes Mother      Current Outpatient Medications:  Marland Kitchen  Multiple Vitamin (MULTIVITAMIN WITH MINERALS) TABS, Take 1 tablet by mouth daily., Disp: , Rfl:  .  omeprazole (PRILOSEC) 40 MG capsule, Take 40 mg by mouth daily., Disp: , Rfl:  .  Probiotic Product (PROBIOTIC DAILY PO), Take 1 capsule by mouth daily at 12 noon., Disp: , Rfl:  .  varenicline (CHANTIX STARTING MONTH PAK) 0.5 MG X 11 & 1 MG X 42 tablet, Take one 0.5 mg tablet by mouth once daily for 3 days, then increase to one 0.5 mg tablet twice daily for 4 days, then increase to one 1 mg tablet twice daily., Disp: 53 tablet, Rfl: 0 .  Vitamin D, Ergocalciferol, (DRISDOL) 1.25 MG (50000 UT) CAPS capsule, Take 1 capsule (50,000 Units total) by mouth 2 (two) times a week.  (Patient not taking: Reported on 11/22/2018), Disp: 24 capsule, Rfl: 1   Allergies  Allergen Reactions  . Penicillins Other (See Comments)    Unknown childhood reaction     Review of Systems  Constitutional: Positive for fatigue.  HENT: Positive for congestion and ear pain.   Eyes: Negative.   Respiratory: Negative.   Cardiovascular: Negative.   Musculoskeletal: Negative for arthralgias.  Neurological: Negative.   Psychiatric/Behavioral: Negative.      Today's Vitals   11/22/18 1526  BP: 118/74  Pulse: 74  Temp: 99 F (37.2 C)  TempSrc: Oral   There is no height or weight on file to calculate BMI.   Objective:  Physical Exam Constitutional:      Appearance: Normal appearance. She is obese.  Neck:     Comments: Left supraclavicular space is swollen Cardiovascular:     Rate and Rhythm: Normal rate and regular rhythm.     Pulses: Normal pulses.     Heart sounds: Normal heart sounds. No murmur.  Pulmonary:     Effort: Pulmonary effort is normal.     Breath sounds: Normal breath sounds.  Skin:    General: Skin is warm and dry.  Neurological:     General: No focal deficit present.     Mental Status: She is alert and oriented to person, place, and time.  Psychiatric:        Mood and Affect: Mood normal.        Behavior: Behavior normal.        Thought Content: Thought content normal.        Judgment: Judgment normal.         Assessment And Plan:   1. Encounter for screening colonoscopy  According to USPTF Colorectal cancer Screening guidelines. Colonoscopy is recommended every 10 years, starting at age 40years.  Will refer to GI for colon cancer screening. - Ambulatory referral to Gastroenterology  2. Tooth decay  Left side of lower tooth with decay  Advised to seek dental care will treat for possible infection since she is also having left side of her neck discomfort - clindamycin (CLEOCIN) 300 MG capsule; Take 1 capsule (300 mg total) by mouth 3 (three)  times daily.  Dispense: 21 capsule; Refill: 0  3. Tobacco use  Continue with chantix,   She has not been taking regularly - varenicline (CHANTIX CONTINUING MONTH PAK) 1 MG tablet; Take 1 tablet (1 mg total) by mouth 2 (two) times daily.  Dispense: 60 tablet; Refill: 2   Minette Brine, FNP    THE PATIENT IS ENCOURAGED TO PRACTICE SOCIAL DISTANCING DUE TO THE COVID-19 PANDEMIC.

## 2018-11-28 ENCOUNTER — Encounter: Payer: Self-pay | Admitting: Nurse Practitioner

## 2019-01-20 ENCOUNTER — Encounter: Payer: Self-pay | Admitting: Nurse Practitioner

## 2019-01-20 ENCOUNTER — Ambulatory Visit (INDEPENDENT_AMBULATORY_CARE_PROVIDER_SITE_OTHER): Payer: 59 | Admitting: Nurse Practitioner

## 2019-01-20 ENCOUNTER — Other Ambulatory Visit: Payer: Self-pay

## 2019-01-20 VITALS — BP 120/80 | HR 83 | Temp 98.4°F | Ht 61.6 in | Wt 177.4 lb

## 2019-01-20 DIAGNOSIS — Z0000 Encounter for general adult medical examination without abnormal findings: Secondary | ICD-10-CM | POA: Diagnosis not present

## 2019-01-20 DIAGNOSIS — E559 Vitamin D deficiency, unspecified: Secondary | ICD-10-CM

## 2019-01-20 DIAGNOSIS — H6121 Impacted cerumen, right ear: Secondary | ICD-10-CM

## 2019-01-20 DIAGNOSIS — R221 Localized swelling, mass and lump, neck: Secondary | ICD-10-CM

## 2019-01-20 DIAGNOSIS — Z1231 Encounter for screening mammogram for malignant neoplasm of breast: Secondary | ICD-10-CM

## 2019-01-20 DIAGNOSIS — R7309 Other abnormal glucose: Secondary | ICD-10-CM

## 2019-01-20 DIAGNOSIS — Z720 Tobacco use: Secondary | ICD-10-CM

## 2019-01-20 LAB — POCT URINALYSIS DIPSTICK
Bilirubin, UA: NEGATIVE
Glucose, UA: NEGATIVE
Ketones, UA: NEGATIVE
Leukocytes, UA: NEGATIVE
Nitrite, UA: NEGATIVE
Protein, UA: NEGATIVE
Spec Grav, UA: 1.025 (ref 1.010–1.025)
Urobilinogen, UA: 0.2 E.U./dL
pH, UA: 5.5 (ref 5.0–8.0)

## 2019-01-20 NOTE — Progress Notes (Signed)
Subjective:     Patient ID: Jacqueline Baxter , female    DOB: 09/01/1965 , 53 y.o.   MRN: [id number]   Chief Complaint  Patient presents with  . Annual Exam   The patient states she uses IUD for birth control. She goes to [facility].  Mammogram last done  Negative for: breast discharge, breast lump(s), breast pain and breast self exam.  Pertinent negatives include abnormal bleeding (hematology), anxiety, decreased libido, depression, difficulty falling sleep, dyspareunia, history of infertility, nocturia, sexual dysfunction, sleep disturbances, urinary incontinence, urinary urgency, vaginal discharge and vaginal itching. Diet: cutting back on her carbohydrates, sugar and salt.The patient states her exercise level is  rarely   . The patient's tobacco use is:  Social History   Tobacco Use  Smoking Status Current Every Day Smoker  . Packs/day: 0.25  . Years: 10.00  . Pack years: 2.50  . Types: Cigarettes  Smokeless Tobacco Never Used  . She has been exposed to passive smoke. The patient's alcohol use is:  Social History   Substance and Sexual Activity  Alcohol Use No   Comment: rare   Additional information: Last pap 2017, she will make an appt to have done  HPI  Here for HM. 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    Wt Readings from Last 3 Encounters: 01/20/19 : 177 lb 6.4 oz (80.5 kg) 09/02/18 : 184 lb 6.4 oz (83.6 kg) 01/13/18 : 183 lb 3.2 oz (83.1 kg)     Past Medical History:  Diagnosis Date  . Acid reflux   . Arthritis   . Headache(784.0)   . Hyperlipidemia   . Vitamin D deficiency disease      Family History  Problem Relation Age of Onset  . Diabetes Mother      Current Outpatient Medications:  Marland Kitchen  Multiple Vitamin (MULTIVITAMIN WITH MINERALS) TABS, Take 1 tablet by mouth daily., Disp: , Rfl:  .  omeprazole (PRILOSEC) 40 MG capsule, Take 40 mg by mouth daily., Disp: , Rfl:  .  clindamycin (CLEOCIN) 300 MG capsule, Take 1 capsule (300 mg  total) by mouth 3 (three) times daily. (Patient not taking: Reported on 01/20/2019), Disp: 21 capsule, Rfl: 0 .  Probiotic Product (PROBIOTIC DAILY PO), Take 1 capsule by mouth daily at 12 noon., Disp: , Rfl:  .  varenicline (CHANTIX CONTINUING MONTH PAK) 1 MG tablet, Take 1 tablet (1 mg total) by mouth 2 (two) times daily. (Patient not taking: Reported on 01/20/2019), Disp: 60 tablet, Rfl: 2 .  Vitamin D, Ergocalciferol, (DRISDOL) 1.25 MG (50000 UT) CAPS capsule, Take 1 capsule (50,000 Units total) by mouth 2 (two) times a week. (Patient not taking: Reported on 11/22/2018), Disp: 24 capsule, Rfl: 1   Allergies  Allergen Reactions  . Penicillins Other (See Comments)    Unknown childhood reaction     Review of Systems  Constitutional: Negative.   HENT: Negative.        Swelling to left clavicle space  Eyes: Negative.   Respiratory: Negative.   Cardiovascular: Negative.   Gastrointestinal: Negative.   Endocrine: Negative.   Genitourinary: Negative.   Musculoskeletal: Negative.   Skin: Negative.   Allergic/Immunologic: Negative.   Neurological: Negative.  Negative for dizziness and headaches.  Hematological: Negative.   Psychiatric/Behavioral: Negative.      Today's Vitals   01/20/19 0844  BP: 120/80  Pulse: 83  Temp: 98.4 F (36.9 C)  TempSrc: Oral  Weight: 177 lb 6.4 oz (80.5 kg)  Height: 5' 1.6" (1.565 m)  PainSc: 0-No pain   Body mass index is 32.87 kg/m.   Objective:  Physical Exam Vitals signs reviewed.  Constitutional:      Appearance: She is well-developed.  HENT:     Head: Normocephalic and atraumatic.     Right Ear: Hearing, ear canal and external ear normal. There is impacted cerumen.     Left Ear: Hearing, tympanic membrane, ear canal and external ear normal. There is no impacted cerumen.  Eyes:     General: Lids are normal.     Extraocular Movements: Extraocular movements intact.     Conjunctiva/sclera: Conjunctivae normal.     Pupils: Pupils are equal,  round, and reactive to light.     Funduscopic exam:    Right eye: No papilledema.        Left eye: No papilledema.  Neck:     Musculoskeletal: Full passive range of motion without pain, normal range of motion and neck supple.     Thyroid: No thyroid mass.     Vascular: No carotid bruit.  Cardiovascular:     Rate and Rhythm: Normal rate and regular rhythm.     Pulses: Normal pulses.     Heart sounds: Normal heart sounds. No murmur.  Pulmonary:     Effort: Pulmonary effort is normal.     Breath sounds: Normal breath sounds.  Chest:     Chest wall: No tenderness.  Abdominal:     General: Bowel sounds are normal. There is no distension.     Palpations: Abdomen is soft. There is no mass.     Tenderness: There is no abdominal tenderness.  Musculoskeletal: Normal range of motion.     Comments: Bilateral leg cramps   Skin:    General: Skin is warm and dry.     Capillary Refill: Capillary refill takes less than 2 seconds.     Comments: Nodule to right wrist inner   Neurological:     Mental Status: She is alert and oriented to person, place, and time.     Cranial Nerves: No cranial nerve deficit.     Sensory: No sensory deficit.  Psychiatric:        Mood and Affect: Mood normal.        Behavior: Behavior normal.        Thought Content: Thought content normal.        Judgment: Judgment normal.         Assessment And Plan:     1. Encounter for general adult medical examination w/o abnormal findings . Behavior modifications discussed and diet history reviewed.   . Pt will continue to exercise regularly and modify diet with low GI, plant based foods and decrease intake of processed foods.  . Recommend intake of daily multivitamin, Vitamin D, and calcium.  . Recommend mammogram and colonoscopy (due 2024) for preventive screenings, as well as recommend immunizations that include influenza, TDAP (up to date) - POCT Urinalysis Dipstick (81002) - CBC with Differential/Platelet  2.  Screening mammogram, encounter for  Pt instructed on Self Breast Exam.According to ACOG guidelines Women aged 69 and older are recommended to get an annual mammogram. Form completed and given to patient contact the [facility] for appointment scheduing.   Pt encouraged to get annual mammogram - MM Digital Screening; Future  3. Vitamin D deficiency  Will check vitamin D level and supplement as needed.     Also encouraged to spend 15 minutes in the sun  daily.  - Vitamin D (25 hydroxy)  4. Tobacco use  Ready to quit: No  Counseling given: Yes  Comment: taking off and on Chantix  Smoking cessation instruction/counseling given:  counseled patient on the dangers of tobacco use, advised patient to stop smoking, and reviewed strategies to maximize success  5. Abnormal glucose  Chronic, stable  Continue with current medications  Encouraged to limit intake of sugary foods and drinks  Encouraged to increase physical activity to 150 minutes per week as tolerated - CMP14+EGFR - Hemoglobin A1c - Lipid Profile  6. Neck swelling  I do not feel a lymph node, soft to area, when lying flat both sides appear to be the same size, will check CBC  [facility] scan at [facility] did not show abnormal nodes - CBC with Differential/Platelet  7. Impacted cerumen of right ear  Removed hard wax to right ear with lighted curette.  Advised to not use qtips, risk for membrane rupture and impacting wax    Minette Brine, FNP  Patient ID: Jacqueline Baxter, female   DOB: 12-05-1965, 53 y.o.   MRN: [id number]

## 2019-01-20 NOTE — Patient Instructions (Signed)
Health Maintenance  Topic Date Due  . PAP SMEAR-Modifier  10/16/2018  . INFLUENZA VACCINE  05/18/2019 (Originally 09/18/2018)  . MAMMOGRAM  02/26/2019  . TETANUS/TDAP  12/29/2020  . COLONOSCOPY  04/27/2022  . HIV Screening  Completed   Health Maintenance, Female Adopting a healthy lifestyle and getting preventive care are important in promoting health and wellness. Ask your health care provider about:  The right schedule for you to have regular tests and exams.  Things you can do on your own to prevent diseases and keep yourself healthy. What should I know about diet, weight, and exercise? Eat a healthy diet   Eat a diet that includes plenty of vegetables, fruits, low-fat dairy products, and lean protein.  Do not eat a lot of foods that are high in solid fats, added sugars, or sodium. Maintain a healthy weight Body mass index (BMI) is used to identify weight problems. It estimates body fat based on height and weight. Your health care provider can help determine your BMI and help you achieve or maintain a healthy weight. Get regular exercise Get regular exercise. This is one of the most important things you can do for your health. Most adults should:  Exercise for at least 150 minutes each week. The exercise should increase your heart rate and make you sweat (moderate-intensity exercise).  Do strengthening exercises at least twice a week. This is in addition to the moderate-intensity exercise.  Spend less time sitting. Even light physical activity can be beneficial. Watch cholesterol and blood lipids Have your blood tested for lipids and cholesterol at 53 years of age, then have this test every 5 years. Have your cholesterol levels checked more often if:  Your lipid or cholesterol levels are high.  You are older than 53 years of age.  You are at high risk for heart disease. What should I know about cancer screening? Depending on your health history and family history, you may  need to have cancer screening at various ages. This may include screening for:  Breast cancer.  Cervical cancer.  Colorectal cancer.  Skin cancer.  Lung cancer. What should I know about heart disease, diabetes, and high blood pressure? Blood pressure and heart disease  High blood pressure causes heart disease and increases the risk of stroke. This is more likely to develop in people who have high blood pressure readings, are of African descent, or are overweight.  Have your blood pressure checked: ? Every 3-5 years if you are 53-70 years of age. ? Every year if you are 53 years old or older. Diabetes Have regular diabetes screenings. This checks your fasting blood sugar level. Have the screening done:  Once every three years after age 10 if you are at a normal weight and have a low risk for diabetes.  More often and at a younger age if you are overweight or have a high risk for diabetes. What should I know about preventing infection? Hepatitis B If you have a higher risk for hepatitis B, you should be screened for this virus. Talk with your health care provider to find out if you are at risk for hepatitis B infection. Hepatitis C Testing is recommended for:  Everyone born from [birth year] through 1965.  Anyone with known risk factors for hepatitis C. Sexually transmitted infections (STIs)  Get screened for STIs, including gonorrhea and chlamydia, if: ? You are sexually active and are younger than 53 years of age. ? You are older than 53 years of age and  your health care provider tells you that you are at risk for this type of infection. ? Your sexual activity has changed since you were last screened, and you are at increased risk for chlamydia or gonorrhea. Ask your health care provider if you are at risk.  Ask your health care provider about whether you are at high risk for HIV. Your health care provider may recommend a prescription medicine to help prevent HIV infection. If you  choose to take medicine to prevent HIV, you should first get tested for HIV. You should then be tested every 3 months for as long as you are taking the medicine. Pregnancy  If you are about to stop having your period (premenopausal) and you may become pregnant, seek counseling before you get pregnant.  Take 400 to 800 micrograms (mcg) of folic acid every day if you become pregnant.  Ask for birth control (contraception) if you want to prevent pregnancy. Osteoporosis and menopause Osteoporosis is a disease in which the bones lose minerals and strength with aging. This can result in bone fractures. If you are 106 years old or older, or if you are at risk for osteoporosis and fractures, ask your health care provider if you should:  Be screened for bone loss.  Take a calcium or vitamin D supplement to lower your risk of fractures.  Be given hormone replacement therapy (HRT) to treat symptoms of menopause. Follow these instructions at home: Lifestyle  Do not use any products that contain nicotine or tobacco, such as cigarettes, e-cigarettes, and chewing tobacco. If you need help quitting, ask your health care provider.  Do not use street drugs.  Do not share needles.  Ask your health care provider for help if you need support or information about quitting drugs. Alcohol use  Do not drink alcohol if: ? Your health care provider tells you not to drink. ? You are pregnant, may be pregnant, or are planning to become pregnant.  If you drink alcohol: ? Limit how much you use to 0-1 drink a day. ? Limit intake if you are breastfeeding.  Be aware of how much alcohol is in your drink. In the [place] one drink equals one 12 oz bottle of beer (355 mL), one 5 oz glass of wine (148 mL), or one 1 oz glass of hard liquor (44 mL). General instructions  Schedule regular health, dental, and eye exams.  Stay current with your vaccines.  Tell your health care provider if: ? You often feel depressed.  ? You have ever been abused or do not feel safe at home. Summary  Adopting a healthy lifestyle and getting preventive care are important in promoting health and wellness.  Follow your health care provider's instructions about healthy diet, exercising, and getting tested or screened for diseases.  Follow your health care provider's instructions on monitoring your cholesterol and blood pressure. This information is not intended to replace advice given to you by your health care provider. Make sure you discuss any questions you have with your health care provider. Document Released: 08/19/2010 Document Revised: 01/27/2018 Document Reviewed: 01/27/2018 Elsevier Patient Education  2020 [employer].

## 2019-01-21 ENCOUNTER — Other Ambulatory Visit: Payer: Self-pay | Admitting: Nurse Practitioner

## 2019-01-21 DIAGNOSIS — E559 Vitamin D deficiency, unspecified: Secondary | ICD-10-CM

## 2019-01-21 LAB — CMP14+EGFR
ALT: 10 IU/L (ref 0–32)
AST: 14 IU/L (ref 0–40)
Albumin/Globulin Ratio: 1.7 (ref 1.2–2.2)
Albumin: 4.4 g/dL (ref 3.8–4.9)
Alkaline Phosphatase: 60 IU/L (ref 39–117)
BUN/Creatinine Ratio: 9 (ref 9–23)
BUN: 7 mg/dL (ref 6–24)
Bilirubin Total: 0.3 mg/dL (ref 0.0–1.2)
CO2: 25 mmol/L (ref 20–29)
Calcium: 9.2 mg/dL (ref 8.7–10.2)
Chloride: 104 mmol/L (ref 96–106)
Creatinine, Ser: 0.78 mg/dL (ref 0.57–1.00)
GFR calc Af Amer: 100 mL/min/{1.73_m2} (ref >59)
GFR calc non Af Amer: 87 mL/min/{1.73_m2} (ref >59)
Globulin, Total: 2.6 g/dL (ref 1.5–4.5)
Glucose: 94 mg/dL (ref 65–99)
Potassium: 4.6 mmol/L (ref 3.5–5.2)
Sodium: 142 mmol/L (ref 134–144)
Total Protein: 7 g/dL (ref 6.0–8.5)

## 2019-01-21 LAB — CBC WITH DIFFERENTIAL/PLATELET
Basophils Absolute: 0.1 10*3/uL (ref 0.0–0.2)
Basos: 1 %
EOS (ABSOLUTE): 0.1 10*3/uL (ref 0.0–0.4)
Eos: 1 %
Hematocrit: 40.3 % (ref 34.0–46.6)
Hemoglobin: 13.4 g/dL (ref 11.1–15.9)
Immature Grans (Abs): 0 10*3/uL (ref 0.0–0.1)
Immature Granulocytes: 0 %
Lymphocytes Absolute: 3.3 10*3/uL — ABNORMAL HIGH (ref 0.7–3.1)
Lymphs: 39 %
MCH: 27.3 pg (ref 26.6–33.0)
MCHC: 33.3 g/dL (ref 31.5–35.7)
MCV: 82 fL (ref 79–97)
Monocytes Absolute: 0.6 10*3/uL (ref 0.1–0.9)
Monocytes: 7 %
Neutrophils Absolute: 4.4 10*3/uL (ref 1.4–7.0)
Neutrophils: 52 %
Platelets: 271 10*3/uL (ref 150–450)
RBC: 4.91 x10E6/uL (ref 3.77–5.28)
RDW: 13.9 % (ref 11.7–15.4)
WBC: 8.3 10*3/uL (ref 3.4–10.8)

## 2019-01-21 LAB — LIPID PANEL
Chol/HDL Ratio: 3.4 ratio (ref 0.0–4.4)
Cholesterol, Total: 201 mg/dL — ABNORMAL HIGH (ref 100–199)
HDL: 60 mg/dL (ref >39)
LDL Chol Calc (NIH): 123 mg/dL — ABNORMAL HIGH (ref 0–99)
Triglycerides: 98 mg/dL (ref 0–149)
VLDL Cholesterol Cal: 18 mg/dL (ref 5–40)

## 2019-01-21 LAB — HEMOGLOBIN A1C
Est. average glucose Bld gHb Est-mCnc: 131 mg/dL
Hgb A1c MFr Bld: 6.2 % — ABNORMAL HIGH (ref 4.8–5.6)

## 2019-01-21 LAB — VITAMIN D 25 HYDROXY (VIT D DEFICIENCY, FRACTURES): Vit D, 25-Hydroxy: 15.4 ng/mL — ABNORMAL LOW (ref 30.0–100.0)

## 2019-01-21 MED ORDER — VITAMIN D (ERGOCALCIFEROL) 1.25 MG (50000 UNIT) PO CAPS: 50000.0000 [IU] | ORAL_CAPSULE | ORAL | 1 refills | Status: DC

## 2019-01-25 LAB — MONONUCLEOSIS SCREEN: Mono Screen: NEGATIVE

## 2019-01-25 LAB — SPECIMEN STATUS REPORT

## 2019-02-22 ENCOUNTER — Other Ambulatory Visit: Payer: 59

## 2019-02-22 ENCOUNTER — Other Ambulatory Visit: Payer: Self-pay

## 2019-02-22 DIAGNOSIS — R899 Unspecified abnormal finding in specimens from other organs, systems and tissues: Secondary | ICD-10-CM

## 2019-02-22 LAB — CBC WITH DIFFERENTIAL/PLATELET
Basophils Absolute: 0.1 10*3/uL (ref 0.0–0.2)
Basos: 1 %
EOS (ABSOLUTE): 0.1 10*3/uL (ref 0.0–0.4)
Eos: 1 %
Hematocrit: 43.5 % (ref 34.0–46.6)
Hemoglobin: 14.2 g/dL (ref 11.1–15.9)
Immature Grans (Abs): 0 10*3/uL (ref 0.0–0.1)
Immature Granulocytes: 0 %
Lymphocytes Absolute: 3.2 10*3/uL — ABNORMAL HIGH (ref 0.7–3.1)
Lymphs: 43 %
MCH: 27.7 pg (ref 26.6–33.0)
MCHC: 32.6 g/dL (ref 31.5–35.7)
MCV: 85 fL (ref 79–97)
Monocytes Absolute: 0.3 10*3/uL (ref 0.1–0.9)
Monocytes: 4 %
Neutrophils Absolute: 3.8 10*3/uL (ref 1.4–7.0)
Neutrophils: 51 %
Platelets: 300 10*3/uL (ref 150–450)
RBC: 5.12 x10E6/uL (ref 3.77–5.28)
RDW: 13.8 % (ref 11.7–15.4)
WBC: 7.5 10*3/uL (ref 3.4–10.8)

## 2019-03-09 ENCOUNTER — Other Ambulatory Visit: Payer: Self-pay | Admitting: Nurse Practitioner

## 2019-03-09 DIAGNOSIS — R221 Localized swelling, mass and lump, neck: Secondary | ICD-10-CM

## 2019-09-08 ENCOUNTER — Other Ambulatory Visit: Payer: Self-pay

## 2019-09-08 ENCOUNTER — Ambulatory Visit
Admission: RE | Admit: 2019-09-08 | Discharge: 2019-09-08 | Disposition: A | Discharge status: Home / Self Care | Payer: 59 | Source: Ambulatory Visit | Attending: Nurse Practitioner | Admitting: Nurse Practitioner

## 2019-09-08 DIAGNOSIS — Z1231 Encounter for screening mammogram for malignant neoplasm of breast: Secondary | ICD-10-CM

## 2019-09-12 ENCOUNTER — Other Ambulatory Visit: Payer: Self-pay | Admitting: Nurse Practitioner

## 2019-09-12 DIAGNOSIS — R928 Other abnormal and inconclusive findings on diagnostic imaging of breast: Secondary | ICD-10-CM

## 2019-09-23 ENCOUNTER — Other Ambulatory Visit: Payer: Self-pay | Admitting: Nurse Practitioner

## 2019-09-23 ENCOUNTER — Other Ambulatory Visit: Payer: Self-pay

## 2019-09-23 ENCOUNTER — Ambulatory Visit: Payer: 59

## 2019-09-23 ENCOUNTER — Ambulatory Visit
Admission: RE | Admit: 2019-09-23 | Discharge: 2019-09-23 | Disposition: A | Discharge status: Home / Self Care | Payer: 59 | Source: Ambulatory Visit | Attending: Nurse Practitioner | Admitting: Nurse Practitioner

## 2019-09-23 DIAGNOSIS — R928 Other abnormal and inconclusive findings on diagnostic imaging of breast: Secondary | ICD-10-CM

## 2020-01-26 ENCOUNTER — Other Ambulatory Visit: Payer: Self-pay

## 2020-01-26 ENCOUNTER — Encounter: Payer: Self-pay | Admitting: Nurse Practitioner

## 2020-01-26 ENCOUNTER — Ambulatory Visit (INDEPENDENT_AMBULATORY_CARE_PROVIDER_SITE_OTHER): Payer: 59 | Admitting: Nurse Practitioner

## 2020-01-26 VITALS — BP 142/90 | HR 85 | Temp 98.3°F | Ht 61.8 in | Wt 159.4 lb

## 2020-01-26 DIAGNOSIS — I10 Essential (primary) hypertension: Secondary | ICD-10-CM | POA: Diagnosis not present

## 2020-01-26 DIAGNOSIS — F419 Anxiety disorder, unspecified: Secondary | ICD-10-CM

## 2020-01-26 DIAGNOSIS — Z716 Tobacco abuse counseling: Secondary | ICD-10-CM

## 2020-01-26 DIAGNOSIS — Z2821 Immunization not carried out because of patient refusal: Secondary | ICD-10-CM

## 2020-01-26 DIAGNOSIS — N63 Unspecified lump in unspecified breast: Secondary | ICD-10-CM

## 2020-01-26 DIAGNOSIS — E559 Vitamin D deficiency, unspecified: Secondary | ICD-10-CM

## 2020-01-26 DIAGNOSIS — Z1159 Encounter for screening for other viral diseases: Secondary | ICD-10-CM

## 2020-01-26 DIAGNOSIS — R7309 Other abnormal glucose: Secondary | ICD-10-CM | POA: Diagnosis not present

## 2020-01-26 DIAGNOSIS — E7800 Pure hypercholesterolemia, unspecified: Secondary | ICD-10-CM | POA: Diagnosis not present

## 2020-01-26 DIAGNOSIS — Z0000 Encounter for general adult medical examination without abnormal findings: Secondary | ICD-10-CM

## 2020-01-26 DIAGNOSIS — Z720 Tobacco use: Secondary | ICD-10-CM

## 2020-01-26 DIAGNOSIS — Z23 Encounter for immunization: Secondary | ICD-10-CM

## 2020-01-26 MED ORDER — SHINGRIX 50 MCG/0.5ML IM SUSR: 0.5000 mL | Freq: Once | INTRAMUSCULAR | 0 refills | Status: AC

## 2020-01-26 MED ORDER — BUPROPION HCL ER (XL) 150 MG PO TB24: 150.0000 mg | ORAL_TABLET | ORAL | 2 refills | Status: DC

## 2020-01-26 NOTE — Patient Instructions (Addendum)
Health Maintenance, Female Adopting a healthy lifestyle and getting preventive care are important in promoting health and wellness. Ask your health care provider about:  The right schedule for you to have regular tests and exams.  Things you can do on your own to prevent diseases and keep yourself healthy. What should I know about diet, weight, and exercise? Eat a healthy diet   Eat a diet that includes plenty of vegetables, fruits, low-fat dairy products, and lean protein.  Do not eat a lot of foods that are high in solid fats, added sugars, or sodium. Maintain a healthy weight Body mass index (BMI) is used to identify weight problems. It estimates body fat based on height and weight. Your health care provider can help determine your BMI and help you achieve or maintain a healthy weight. Get regular exercise Get regular exercise. This is one of the most important things you can do for your health. Most adults should:  Exercise for at least 150 minutes each week. The exercise should increase your heart rate and make you sweat (moderate-intensity exercise).  Do strengthening exercises at least twice a week. This is in addition to the moderate-intensity exercise.  Spend less time sitting. Even light physical activity can be beneficial. Watch cholesterol and blood lipids Have your blood tested for lipids and cholesterol at 54 years of age, then have this test every 5 years. Have your cholesterol levels checked more often if:  Your lipid or cholesterol levels are high.  You are older than 54 years of age.  You are at high risk for heart disease. What should I know about cancer screening? Depending on your health history and family history, you may need to have cancer screening at various ages. This may include screening for:  Breast cancer.  Cervical cancer.  Colorectal cancer.  Skin cancer.  Lung cancer. What should I know about heart disease, diabetes, and high blood  pressure? Blood pressure and heart disease  High blood pressure causes heart disease and increases the risk of stroke. This is more likely to develop in people who have high blood pressure readings, are of African descent, or are overweight.  Have your blood pressure checked: ? Every 3-5 years if you are 18-39 years of age. ? Every year if you are 40 years old or older. Diabetes Have regular diabetes screenings. This checks your fasting blood sugar level. Have the screening done:  Once every three years after age 40 if you are at a normal weight and have a low risk for diabetes.  More often and at a younger age if you are overweight or have a high risk for diabetes. What should I know about preventing infection? Hepatitis B If you have a higher risk for hepatitis B, you should be screened for this virus. Talk with your health care provider to find out if you are at risk for hepatitis B infection. Hepatitis C Testing is recommended for:  Everyone born from 1945 through 1965.  Anyone with known risk factors for hepatitis C. Sexually transmitted infections (STIs)  Get screened for STIs, including gonorrhea and chlamydia, if: ? You are sexually active and are younger than 54 years of age. ? You are older than 54 years of age and your health care provider tells you that you are at risk for this type of infection. ? Your sexual activity has changed since you were last screened, and you are at increased risk for chlamydia or gonorrhea. Ask your health care provider if   you are at risk.  Ask your health care provider about whether you are at high risk for HIV. Your health care provider may recommend a prescription medicine to help prevent HIV infection. If you choose to take medicine to prevent HIV, you should first get tested for HIV. You should then be tested every 3 months for as long as you are taking the medicine. Pregnancy  If you are about to stop having your period (premenopausal) and  you may become pregnant, seek counseling before you get pregnant.  Take 400 to 800 micrograms (mcg) of folic acid every day if you become pregnant.  Ask for birth control (contraception) if you want to prevent pregnancy. Osteoporosis and menopause Osteoporosis is a disease in which the bones lose minerals and strength with aging. This can result in bone fractures. If you are 62 years old or older, or if you are at risk for osteoporosis and fractures, ask your health care provider if you should:  Be screened for bone loss.  Take a calcium or vitamin D supplement to lower your risk of fractures.  Be given hormone replacement therapy (HRT) to treat symptoms of menopause. Follow these instructions at home: Lifestyle  Do not use any products that contain nicotine or tobacco, such as cigarettes, e-cigarettes, and chewing tobacco. If you need help quitting, ask your health care provider.  Do not use street drugs.  Do not share needles.  Ask your health care provider for help if you need support or information about quitting drugs. Alcohol use  Do not drink alcohol if: ? Your health care provider tells you not to drink. ? You are pregnant, may be pregnant, or are planning to become pregnant.  If you drink alcohol: ? Limit how much you use to 0-1 drink a day. ? Limit intake if you are breastfeeding.  Be aware of how much alcohol is in your drink. In the [place] one drink equals one 12 oz bottle of beer (355 mL), one 5 oz glass of wine (148 mL), or one 1 oz glass of hard liquor (44 mL). General instructions  Schedule regular health, dental, and eye exams.  Stay current with your vaccines.  Tell your health care provider if: ? You often feel depressed. ? You have ever been abused or do not feel safe at home. Summary  Adopting a healthy lifestyle and getting preventive care are important in promoting health and wellness.  Follow your health care provider's instructions about healthy  diet, exercising, and getting tested or screened for diseases.  Follow your health care provider's instructions on monitoring your cholesterol and blood pressure. This information is not intended to replace advice given to you by your health care provider. Make sure you discuss any questions you have with your health care provider. Document Revised: 01/27/2018 Document Reviewed: 01/27/2018 Elsevier Patient Education  [employer].  COVID-19 Vaccine Information can be found at: ShippingScam.co.uk For questions related to vaccine distribution or appointments, please email [email or web address] or call [phone].     General Headache Without Cause A headache is pain or discomfort that is felt around the head or neck area. There are many causes and types of headaches. In some cases, the cause may not be found. Follow these instructions at home: Watch your condition for any changes. Let your doctor know about them. Take these steps to help with your condition: Managing pain      Take over-the-counter and prescription medicines only as told by your doctor.  Lie down  in a dark, quiet room when you have a headache.  If told, put ice on your head and neck area: ? Put ice in a plastic bag. ? Place a towel between your skin and the bag. ? Leave the ice on for 20 minutes, 2-3 times per day.  If told, put heat on the affected area. Use the heat source that your doctor recommends, such as a moist heat pack or a heating pad. ? Place a towel between your skin and the heat source. ? Leave the heat on for 20-30 minutes. ? Remove the heat if your skin turns bright red. This is very important if you are unable to feel pain, heat, or cold. You may have a greater risk of getting burned.  Keep lights dim if bright lights bother you or make your headaches worse. Eating and drinking  Eat meals on a regular schedule.  If you drink  alcohol: ? Limit how much you use to:  0-1 drink a day for women.  0-2 drinks a day for men. ? Be aware of how much alcohol is in your drink. In the [place] one drink equals one 12 oz bottle of beer (355 mL), one 5 oz glass of wine (148 mL), or one 1 oz glass of hard liquor (44 mL).  Stop drinking caffeine, or reduce how much caffeine you drink. General instructions   Keep a journal to find out if certain things bring on headaches. For example, write down: ? What you eat and drink. ? How much sleep you get. ? Any change to your diet or medicines.  Get a massage or try other ways to relax.  Limit stress.  Sit up straight. Do not tighten (tense) your muscles.  Do not use any products that contain nicotine or tobacco. This includes cigarettes, e-cigarettes, and chewing tobacco. If you need help quitting, ask your doctor.  Exercise regularly as told by your doctor.  Get enough sleep. This often means 7-9 hours of sleep each night.  Keep all follow-up visits as told by your doctor. This is important. Contact a doctor if:  Your symptoms are not helped by medicine.  You have a headache that feels different than the other headaches.  You feel sick to your stomach (nauseous) or you throw up (vomit).  You have a fever. Get help right away if:  Your headache gets very bad quickly.  Your headache gets worse after a lot of physical activity.  You keep throwing up.  You have a stiff neck.  You have trouble seeing.  You have trouble speaking.  You have pain in the eye or ear.  Your muscles are weak or you lose muscle control.  You lose your balance or have trouble walking.  You feel like you will pass out (faint) or you pass out.  You are mixed up (confused).  You have a seizure. Summary  A headache is pain or discomfort that is felt around the head or neck area.  There are many causes and types of headaches. In some cases, the cause may not be found.  Keep a  journal to help find out what causes your headaches. Watch your condition for any changes. Let your doctor know about them.  Contact a doctor if you have a headache that is different from usual, or if your headache is not helped by medicine.  Get help right away if your headache gets very bad, you throw up, you have trouble seeing, you lose your balance,  or you have a seizure. This information is not intended to replace advice given to you by your health care provider. Make sure you discuss any questions you have with your health care provider. Document Revised: 08/24/2017 Document Reviewed: 08/24/2017 Elsevier Patient Education  [place].

## 2020-01-26 NOTE — Progress Notes (Signed)
I,Yamilka Roman [employer] as a Education administrator for [employer], FNP.,have documented all relevant documentation on the behalf of Minette Brine, FNP,as directed by  Minette Brine, FNP while in the presence of Minette Brine, [place].  This visit occurred during the SARS-CoV-2 public health emergency.  Safety protocols were in place, including screening questions prior to the visit, additional usage of staff PPE, and extensive cleaning of exam room while observing appropriate contact time as indicated for disinfecting solutions.  Subjective:     Patient ID: Jacqueline Baxter , female    DOB: 1965-08-13 , 54 y.o.   MRN: [id number]   Chief Complaint  Patient presents with  . Annual Exam    HPI  Here for HM. Patient goes to [facility] she is unsure when she had her last pap. She will call to schedule an appt.    Wt Readings from Last 3 Encounters: 01/26/20 : 159 lb 6.4 oz (72.3 kg) 01/20/19 : 177 lb 6.4 oz (80.5 kg) 09/02/18 : 184 lb 6.4 oz (83.6 kg)  She has not received her covid vaccine. She is tested weekly at her job.  She had tried Chantix and stopped after the first month due to lack of commitment.  She has taken wellbutrin in the past and stopped due to doing better. Has been having more stressors at work and personal life.    BP Readings from Last 3 Encounters: 01/26/20 : (!) 142/90 01/20/19 : 120/80 11/22/18 : 118/74     Past Medical History:  Diagnosis Date  . Acid reflux   . Arthritis   . Headache(784.0)   . Hyperlipidemia   . Vitamin D deficiency disease      Family History  Problem Relation Age of Onset  . Diabetes Mother      Current Outpatient Medications:  .  cholecalciferol (VITAMIN D3) 25 MCG (1000 UNIT) tablet, Take 1,000 Units by mouth daily., Disp: , Rfl:  .  Multiple Vitamin (MULTIVITAMIN WITH MINERALS) TABS, Take 1 tablet by mouth daily., Disp: , Rfl:  .  omeprazole (PRILOSEC) 40 MG capsule, Take 40 mg by mouth daily., Disp: , Rfl:  .  buPROPion  (WELLBUTRIN XL) 150 MG 24 hr tablet, Take 1 tablet (150 mg total) by mouth every morning., Disp: 30 tablet, Rfl: 2 .  Probiotic Product (PROBIOTIC DAILY PO), Take 1 capsule by mouth daily at 12 noon. (Patient not taking: Reported on 01/26/2020), Disp: , Rfl:  .  Zoster Vaccine Adjuvanted [facility]) injection, Inject 0.5 mLs into the muscle once for 1 dose., Disp: 0.5 mL, Rfl: 0   Allergies  Allergen Reactions  . Penicillins Other (See Comments)    Unknown childhood reaction      The patient states she uses IUD for birth control. Last LMP was No LMP recorded. (Menstrual status: IUD).. Negative for Dysmenorrhea and Negative for Menorrhagia. Negative for: breast discharge, breast lump(s), breast pain and breast self exam. Associated symptoms include abnormal vaginal bleeding. Pertinent negatives include abnormal bleeding (hematology), anxiety, decreased libido, depression, difficulty falling sleep, dyspareunia, history of infertility, nocturia, sexual dysfunction, sleep disturbances, urinary incontinence, urinary urgency, vaginal discharge and vaginal itching. Diet regular; intermittent fasting. She has been trying to avoid sweets and sugary drinks. Trying not to eat as much bread and limiting fried foods. The patient states her exercise level is moderate and intermittent fasting.    The patient's tobacco use is:  Social History   Tobacco Use  Smoking Status Current Every Day Smoker  . Packs/day: 0.25  .  Years: 20.00  . Pack years: 5.00  . Types: Cigarettes  Smokeless Tobacco Never Used  Tobacco Comment   she is smoking 6 - 7 cigarettes a day  . She has been exposed to passive smoke. The patient's alcohol use is:  Social History   Substance and Sexual Activity  Alcohol Use No   Comment: rare  . Additional information: Last pap 2017, she is going to call her GYN for appt  Review of Systems  Constitutional: Negative.   HENT: Negative.   Eyes: Negative.   Respiratory: Negative.   Negative for cough.   Cardiovascular: Negative.  Negative for chest pain, palpitations and leg swelling.  Gastrointestinal: Negative.   Endocrine: Negative.   Genitourinary: Negative.   Musculoskeletal: Negative.   Skin: Negative.   Neurological: Positive for headaches (will sometimes wake up with headaches). Negative for dizziness.  Hematological: Negative.   Psychiatric/Behavioral: Negative.      Today's Vitals   01/26/20 0844  BP: (!) 142/90  Pulse: 85  Temp: 98.3 F (36.8 C)  TempSrc: Oral  Weight: 159 lb 6.4 oz (72.3 kg)  Height: 5' 1.8" (1.57 m)  PainSc: 0-No pain   Body mass index is 29.34 kg/m.   Objective:  Physical Exam Constitutional:      General: She is not in acute distress.    Appearance: Normal appearance. She is well-developed.  HENT:     Head: Normocephalic and atraumatic.     Right Ear: Hearing, tympanic membrane, ear canal and external ear normal. There is no impacted cerumen.     Left Ear: Hearing, tympanic membrane, ear canal and external ear normal. There is no impacted cerumen.     Nose:     Comments: Deferred - masked    Mouth/Throat:     Comments: Deferred - masked Eyes:     General: Lids are normal.     Extraocular Movements: Extraocular movements intact.     Conjunctiva/sclera: Conjunctivae normal.     Pupils: Pupils are equal, round, and reactive to light.     Funduscopic exam:    Right eye: No papilledema.        Left eye: No papilledema.  Neck:     Thyroid: No thyroid mass.     Vascular: No carotid bruit.  Cardiovascular:     Rate and Rhythm: Normal rate and regular rhythm.     Pulses: Normal pulses.     Heart sounds: Normal heart sounds. No murmur heard.   Pulmonary:     Effort: Pulmonary effort is normal. No respiratory distress.     Breath sounds: Normal breath sounds. No wheezing.  Chest:     Chest wall: No mass.  Breasts:     Tanner Score is 5.     Right: Normal. No mass, tenderness, axillary adenopathy or  supraclavicular adenopathy.     Left: Normal. No mass, tenderness, axillary adenopathy or supraclavicular adenopathy.    Abdominal:     General: Abdomen is flat. Bowel sounds are normal. There is no distension.     Palpations: Abdomen is soft.     Tenderness: There is no abdominal tenderness.  Genitourinary:    Rectum: Guaiac result negative.  Musculoskeletal:        General: No swelling. Normal range of motion.     Cervical back: Full passive range of motion without pain, normal range of motion and neck supple.     Right lower leg: No edema.     Left lower leg: No edema.  Lymphadenopathy:     Upper Body:     Right upper body: No supraclavicular, axillary or pectoral adenopathy.     Left upper body: No supraclavicular, axillary or pectoral adenopathy.  Skin:    General: Skin is warm and dry.     Capillary Refill: Capillary refill takes less than 2 seconds.  Neurological:     General: No focal deficit present.     Mental Status: She is alert and oriented to person, place, and time.     Cranial Nerves: No cranial nerve deficit.     Sensory: No sensory deficit.  Psychiatric:        Mood and Affect: Mood normal.        Behavior: Behavior normal.        Thought Content: Thought content normal.        Judgment: Judgment normal.         Assessment And Plan:     1. Encounter for general adult medical examination w/o abnormal findings . Behavior modifications discussed and diet history reviewed.   . Pt will continue to exercise regularly and modify diet with low GI, plant based foods and decrease intake of processed foods.  . Recommend intake of daily multivitamin, Vitamin D, and calcium.  . Recommend mammogram and colonoscopy (both are up to date) for preventive screenings, as well as recommend immunizations that include influenza (declined), TDAP, and Shingles (Rx sent to pharmacy) - CBC  2. Influenza vaccination declined  Patient declined influenza vaccination at this time.  Patient is aware that influenza vaccine prevents illness in 70% of healthy people, and reduces hospitalizations to 30-70% in elderly. This vaccine is recommended annually. Pt is willing to accept risk associated with refusing vaccination.  3. COVID-19 vaccination declined  Declines covid 19 vaccine. Discussed risk of covid 9 and if she changes her mind about the vaccine to call the office.  Encouraged to take multivitamin, vitamin d, vitamin c and zinc to increase immune system. Aware can call office if would like to have vaccine here at office.   4. Tobacco use  She has tried chantix in the past however stopped after one month, she is willing to try wellbutrin as she feels this may also help her mood/anxiety  5. Tobacco abuse counseling Smoking cessation instruction/counseling given:  counseled patient on the dangers of tobacco use, advised patient to stop smoking, and reviewed strategies to maximize success  6. Encounter for hepatitis C screening test for low risk patient  Will check Hepatitis C screening due to recent recommendations to screen all adults 18 years and older - Hepatitis C antibody  7. Abnormal glucose  Chronic, she has lost approximately 15 lbs in the last year this may help improve these levels - Hemoglobin A1c  8. Vitamin D deficiency  Will check vitamin D level and supplement as needed.     Also encouraged to spend 15 minutes in the sun daily.  - VITAMIN D 25 Hydroxy (Vit-D Deficiency, Fractures)  9. Anxiety  This is a recurring issue which seems to be getting worse  Hopefully the wellbutrin will help  10. Elevated blood pressure reading in office with diagnosis of hypertension . B/P is slightly elevated today . She has been having headaches unsure if related to her elevated blood pressure . Encouraged to stay well hydrated with water - CMP14+EGFR  11. Elevated cholesterol  Will check lipid panel  Avoid fried and fatty foods - Lipid panel  12.  Breast nodule  Right  breast nodule palpated to inner quadrant at 11 o'clock  Will order diagnostic mammogram - MM Digital Diagnostic Unilat R; Future     Patient was given opportunity to ask questions. Patient verbalized understanding of the plan and was able to repeat key elements of the plan. All questions were answered to their satisfaction.     Teola Bradley, FNP, have reviewed all documentation for this visit. The documentation on 01/26/20 for the exam, diagnosis, procedures, and orders are all accurate and complete.  THE PATIENT IS ENCOURAGED TO PRACTICE SOCIAL DISTANCING DUE TO THE COVID-19 PANDEMIC.

## 2020-01-27 LAB — CMP14+EGFR
ALT: 9 IU/L (ref 0–32)
AST: 16 IU/L (ref 0–40)
Albumin/Globulin Ratio: 2.1 (ref 1.2–2.2)
Albumin: 4.8 g/dL (ref 3.8–4.9)
Alkaline Phosphatase: 55 IU/L (ref 44–121)
BUN/Creatinine Ratio: 10 (ref 9–23)
BUN: 8 mg/dL (ref 6–24)
Bilirubin Total: 0.3 mg/dL (ref 0.0–1.2)
CO2: 24 mmol/L (ref 20–29)
Calcium: 9.5 mg/dL (ref 8.7–10.2)
Chloride: 101 mmol/L (ref 96–106)
Creatinine, Ser: 0.8 mg/dL (ref 0.57–1.00)
GFR calc Af Amer: 97 mL/min/{1.73_m2} (ref >59)
GFR calc non Af Amer: 84 mL/min/{1.73_m2} (ref >59)
Globulin, Total: 2.3 g/dL (ref 1.5–4.5)
Glucose: 88 mg/dL (ref 65–99)
Potassium: 4.4 mmol/L (ref 3.5–5.2)
Sodium: 140 mmol/L (ref 134–144)
Total Protein: 7.1 g/dL (ref 6.0–8.5)

## 2020-01-27 LAB — CBC
Hematocrit: 44.9 % (ref 34.0–46.6)
Hemoglobin: 14.7 g/dL (ref 11.1–15.9)
MCH: 27.5 pg (ref 26.6–33.0)
MCHC: 32.7 g/dL (ref 31.5–35.7)
MCV: 84 fL (ref 79–97)
Platelets: 285 10*3/uL (ref 150–450)
RBC: 5.35 x10E6/uL — ABNORMAL HIGH (ref 3.77–5.28)
RDW: 13.3 % (ref 11.7–15.4)
WBC: 7 10*3/uL (ref 3.4–10.8)

## 2020-01-27 LAB — HEMOGLOBIN A1C
Est. average glucose Bld gHb Est-mCnc: 123 mg/dL
Hgb A1c MFr Bld: 5.9 % — ABNORMAL HIGH (ref 4.8–5.6)

## 2020-01-27 LAB — HEPATITIS C ANTIBODY: Hep C Virus Ab: <0.1 s/co ratio (ref 0.0–0.9)

## 2020-01-27 LAB — VITAMIN D 25 HYDROXY (VIT D DEFICIENCY, FRACTURES): Vit D, 25-Hydroxy: 21.1 ng/mL — ABNORMAL LOW (ref 30.0–100.0)

## 2020-01-30 LAB — LIPID PANEL
Chol/HDL Ratio: 3.3 ratio (ref 0.0–4.4)
Cholesterol, Total: 219 mg/dL — ABNORMAL HIGH (ref 100–199)
HDL: 67 mg/dL (ref >39)
LDL Chol Calc (NIH): 140 mg/dL — ABNORMAL HIGH (ref 0–99)
Triglycerides: 67 mg/dL (ref 0–149)
VLDL Cholesterol Cal: 12 mg/dL (ref 5–40)

## 2020-01-30 LAB — SPECIMEN STATUS REPORT

## 2020-02-15 ENCOUNTER — Other Ambulatory Visit: Payer: Self-pay | Admitting: Nurse Practitioner

## 2020-02-15 DIAGNOSIS — E559 Vitamin D deficiency, unspecified: Secondary | ICD-10-CM

## 2020-02-15 MED ORDER — VITAMIN D (ERGOCALCIFEROL) 1.25 MG (50000 UNIT) PO CAPS: 50000.0000 [IU] | ORAL_CAPSULE | ORAL | 0 refills | Status: DC

## 2020-02-18 DIAGNOSIS — G510 Bell's palsy: Secondary | ICD-10-CM

## 2020-02-18 HISTORY — DX: Bell's palsy: G51.0

## 2020-02-29 ENCOUNTER — Ambulatory Visit: Payer: 59 | Admitting: Nurse Practitioner

## 2020-03-12 ENCOUNTER — Other Ambulatory Visit: Payer: Self-pay

## 2020-03-12 ENCOUNTER — Ambulatory Visit: Payer: 59 | Admitting: Nurse Practitioner

## 2020-03-12 ENCOUNTER — Encounter: Payer: Self-pay | Admitting: Nurse Practitioner

## 2020-03-12 VITALS — BP 124/82 | HR 72 | Temp 98.0°F | Ht 61.8 in | Wt 162.4 lb

## 2020-03-12 DIAGNOSIS — Z716 Tobacco abuse counseling: Secondary | ICD-10-CM

## 2020-03-12 DIAGNOSIS — Z720 Tobacco use: Secondary | ICD-10-CM | POA: Diagnosis not present

## 2020-03-12 DIAGNOSIS — F419 Anxiety disorder, unspecified: Secondary | ICD-10-CM | POA: Diagnosis not present

## 2020-03-12 MED ORDER — BUPROPION HCL ER (XL) 150 MG PO TB24: 150.0000 mg | ORAL_TABLET | ORAL | 1 refills | Status: DC

## 2020-03-12 NOTE — Progress Notes (Signed)
I,Yamilka Roman [employer] as a Education administrator for [employer], FNP.,have documented all relevant documentation on the behalf of Minette Brine, FNP,as directed by  Minette Brine, FNP while in the presence of Minette Brine, [place]. This visit occurred during the SARS-CoV-2 public health emergency.  Safety protocols were in place, including screening questions prior to the visit, additional usage of staff PPE, and extensive cleaning of exam room while observing appropriate contact time as indicated for disinfecting solutions.  Subjective:     Patient ID: Jacqueline Baxter , female    DOB: [birth year] , 55 y.o.   MRN: [id number]   Chief Complaint  Patient presents with  . med check    Patient stated the wellbutrin has been helping her with her anxiety.     HPI  Patient presents today for a med check on her wellbutrin. She stated she is tolerating it well. She has also not smoking as much during the day.  Wt Readings from Last 3 Encounters: 03/12/20 : 162 lb 6.4 oz (73.7 kg) 01/26/20 : 159 lb 6.4 oz (72.3 kg) 01/20/19 : 177 lb 6.4 oz (80.5 kg)     Past Medical History:  Diagnosis Date  . Acid reflux   . Arthritis   . Headache(784.0)   . Hyperlipidemia   . Vitamin D deficiency disease      Family History  Problem Relation Age of Onset  . Diabetes Mother      Current Outpatient Medications:  Marland Kitchen  Multiple Vitamin (MULTIVITAMIN WITH MINERALS) TABS, Take 1 tablet by mouth daily., Disp: , Rfl:  .  omeprazole (PRILOSEC) 40 MG capsule, Take 40 mg by mouth daily., Disp: , Rfl:  .  Probiotic Product (PROBIOTIC DAILY PO), Take 1 capsule by mouth daily at 12 noon., Disp: , Rfl:  .  Vitamin D, Ergocalciferol, (DRISDOL) 1.25 MG (50000 UNIT) CAPS capsule, Take 1 capsule (50,000 Units total) by mouth every 7 (seven) days., Disp: 12 capsule, Rfl: 0 .  buPROPion (WELLBUTRIN XL) 150 MG 24 hr tablet, Take 1 tablet (150 mg total) by mouth every morning., Disp: 90 tablet, Rfl: 1   Allergies  Allergen  Reactions  . Penicillins Other (See Comments)    Unknown childhood reaction     Review of Systems  Constitutional: Negative.   Respiratory: Negative.   Cardiovascular: Negative.   Gastrointestinal: Negative.   Neurological: Negative for dizziness and headaches.  Psychiatric/Behavioral: Negative.      Today's Vitals   03/12/20 1613  BP: 124/82  Pulse: 72  Temp: 98 F (36.7 C)  TempSrc: Oral  Weight: 162 lb 6.4 oz (73.7 kg)  Height: 5' 1.8" (1.57 m)  PainSc: 0-No pain   Body mass index is 29.9 kg/m.   Objective:  Physical Exam Constitutional:      General: She is not in acute distress.    Appearance: Normal appearance.  Cardiovascular:     Pulses: Normal pulses.     Heart sounds: Normal heart sounds. No murmur heard.   Pulmonary:     Effort: Pulmonary effort is normal. No respiratory distress.     Breath sounds: Normal breath sounds.  Neurological:     General: No focal deficit present.     Mental Status: She is alert and oriented to person, place, and time.     Cranial Nerves: No cranial nerve deficit.  Psychiatric:        Mood and Affect: Mood normal.        Behavior: Behavior normal.  Thought Content: Thought content normal.        Judgment: Judgment normal.         Assessment And Plan:     1. Anxiety  Improving with wellbutrin  Continue with current dose - buPROPion (WELLBUTRIN XL) 150 MG 24 hr tablet; Take 1 tablet (150 mg total) by mouth every morning.  Dispense: 90 tablet; Refill: 1  2. Tobacco use She does feel the wellbutrin helps cut back on her smoking - buPROPion (WELLBUTRIN XL) 150 MG 24 hr tablet; Take 1 tablet (150 mg total) by mouth every morning.  Dispense: 90 tablet; Refill: 1  3. Tobacco abuse counseling  Smoking cessation instruction/counseling given:  counseled patient on the dangers of tobacco use, advised patient to stop smoking, and reviewed strategies to maximize success   Patient was given opportunity to ask  questions. Patient verbalized understanding of the plan and was able to repeat key elements of the plan. All questions were answered to their satisfaction.  Minette Brine, FNP   I, Minette Brine, FNP, have reviewed all documentation for this visit. The documentation on 03/12/20 for the exam, diagnosis, procedures, and orders are all accurate and complete.   THE PATIENT IS ENCOURAGED TO PRACTICE SOCIAL DISTANCING DUE TO THE COVID-19 PANDEMIC.

## 2020-03-27 ENCOUNTER — Ambulatory Visit
Admission: RE | Admit: 2020-03-27 | Discharge: 2020-03-27 | Disposition: A | Discharge status: Home / Self Care | Payer: 59 | Source: Ambulatory Visit | Attending: Nurse Practitioner | Admitting: Nurse Practitioner

## 2020-03-27 ENCOUNTER — Other Ambulatory Visit: Payer: Self-pay

## 2020-03-27 DIAGNOSIS — R928 Other abnormal and inconclusive findings on diagnostic imaging of breast: Secondary | ICD-10-CM

## 2020-04-05 ENCOUNTER — Encounter: Payer: Self-pay | Admitting: Nurse Practitioner

## 2020-06-11 ENCOUNTER — Ambulatory Visit: Payer: 59 | Admitting: Nurse Practitioner

## 2020-07-23 ENCOUNTER — Emergency Department (HOSPITAL_BASED_OUTPATIENT_CLINIC_OR_DEPARTMENT_OTHER)
Admission: EM | Admit: 2020-07-23 | Discharge: 2020-07-23 | Disposition: A | Discharge status: Home / Self Care | Payer: 59 | Attending: Emergency Medicine | Admitting: Emergency Medicine

## 2020-07-23 ENCOUNTER — Other Ambulatory Visit: Payer: Self-pay

## 2020-07-23 ENCOUNTER — Encounter (HOSPITAL_BASED_OUTPATIENT_CLINIC_OR_DEPARTMENT_OTHER): Payer: Self-pay | Admitting: *Deleted

## 2020-07-23 DIAGNOSIS — R29810 Facial weakness: Secondary | ICD-10-CM | POA: Diagnosis present

## 2020-07-23 DIAGNOSIS — G510 Bell's palsy: Secondary | ICD-10-CM | POA: Insufficient documentation

## 2020-07-23 DIAGNOSIS — F17210 Nicotine dependence, cigarettes, uncomplicated: Secondary | ICD-10-CM | POA: Insufficient documentation

## 2020-07-23 DIAGNOSIS — H9202 Otalgia, left ear: Secondary | ICD-10-CM | POA: Diagnosis not present

## 2020-07-23 LAB — PREGNANCY, URINE: Preg Test, Ur: NEGATIVE

## 2020-07-23 MED ORDER — ARTIFICIAL TEARS OPHTHALMIC OINT: TOPICAL_OINTMENT | Freq: Every evening | OPHTHALMIC | 0 refills | Status: AC | PRN

## 2020-07-23 MED ORDER — PREDNISONE 20 MG PO TABS: 40.0000 mg | ORAL_TABLET | Freq: Every day | ORAL | 0 refills | Status: AC

## 2020-07-23 MED ORDER — VALACYCLOVIR HCL 1 G PO TABS: 1000.0000 mg | ORAL_TABLET | Freq: Three times a day (TID) | ORAL | 0 refills | Status: DC

## 2020-07-23 MED ORDER — HYPROMELLOSE (GONIOSCOPIC) 2.5 % OP SOLN: 1.0000 [drp] | Freq: Four times a day (QID) | OPHTHALMIC | 12 refills | Status: AC | PRN

## 2020-07-23 NOTE — ED Triage Notes (Signed)
C/o left ear pain x 3 days , today left ear swelling today

## 2020-07-23 NOTE — ED Notes (Signed)
No sign pad in rm 14

## 2020-07-23 NOTE — ED Provider Notes (Signed)
[facility] EMERGENCY DEPARTMENT Provider Note   CSN: [id number] Arrival date & time: 07/23/20  1304     History Chief Complaint  Patient presents with  . Otalgia  . Facial Droop    Jacqueline Baxter is a 55 y.o. female past medical history of hyperlipidemia, vitamin D deficiency who presents for evaluation of sided facial droop, left ear pain.  Patient reports that about 2 days ago, she fell like her ear was aching.  She did not know if she was bitten by something or stung by something.  She does not recall any incident.  She reports yesterday morning when she woke up, she noticed that the left side of her face was having difficulty moving it.  She denies any numbness but states she feels like it is drooping.  This morning, she felt like her ear was slightly swollen.  She has not noted any fevers.  She denies any numbness/weakness of arms or legs.  She has not any chest pain, difficulty breathing.   The history is provided by the patient.       Past Medical History:  Diagnosis Date  . Acid reflux   . Arthritis   . Headache(784.0)   . Hyperlipidemia   . Vitamin D deficiency disease     Patient Active Problem List   Diagnosis Date Noted  . ANA positive 10/23/2016  . Multiple joint pain 10/23/2016  . History of headache 10/23/2016  . Tobacco use 10/23/2016  . History of gastroesophageal reflux (GERD) 10/23/2016  . History of cholecystectomy 10/23/2016  . Symptomatic cholelithiasis 07/20/2012    Past Surgical History:  Procedure Laterality Date  . BREAST BIOPSY Right 2017  . CHOLECYSTECTOMY N/A 08/12/2012   Procedure: LAPAROSCOPIC CHOLECYSTECTOMY POSSIBLE IOC;  Surgeon: Harl Bowie, MD;  Location: [facility];  Service: General;  Laterality: N/A;  . TONSILLECTOMY       OB History   No obstetric history on file.     Family History  Problem Relation Age of Onset  . Diabetes Mother     Social History   Tobacco Use  . Smoking status: Current Every Day  Smoker    Packs/day: 0.25    Years: 20.00    Pack years: 5.00    Types: Cigarettes  . Smokeless tobacco: Never Used  . Tobacco comment: she is smoking 6 - 7 cigarettes a day  Substance Use Topics  . Alcohol use: No    Comment: rare  . Drug use: No    Home Medications Prior to Admission medications   Medication Sig Start Date End Date Taking? Authorizing Provider  artificial tears (LACRILUBE) OINT ophthalmic ointment Place into both eyes at bedtime as needed for up to 10 days for dry eyes. 07/23/20 08/02/20 Yes Volanda Napoleon, PA-C  hydroxypropyl methylcellulose / hypromellose (ISOPTO TEARS / GONIOVISC) 2.5 % ophthalmic solution Place 1 drop into the left eye 4 (four) times daily as needed for dry eyes. 07/23/20  Yes Volanda Napoleon, PA-C  predniSONE (DELTASONE) 20 MG tablet Take 2 tablets (40 mg total) by mouth daily for 4 days. 07/23/20 07/27/20 Yes Volanda Napoleon, PA-C  valACYclovir (VALTREX) 1000 MG tablet Take 1 tablet (1,000 mg total) by mouth 3 (three) times daily. 07/23/20  Yes Volanda Napoleon, PA-C  Multiple Vitamin (MULTIVITAMIN WITH MINERALS) TABS Take 1 tablet by mouth daily.    [provider]  omeprazole (PRILOSEC) 40 MG capsule Take 40 mg by mouth daily.    [provider]  Probiotic Product (PROBIOTIC DAILY PO) Take 1 capsule by mouth daily at 12 noon.    [provider]  Vitamin D, Ergocalciferol, (DRISDOL) 1.25 MG (50000 UNIT) CAPS capsule Take 1 capsule (50,000 Units total) by mouth every 7 (seven) days. 02/15/20   Minette Brine, FNP    Allergies    Penicillins  Review of Systems   Review of Systems  Constitutional: Negative for fever.  HENT: Positive for ear pain.   Respiratory: Negative for cough and shortness of breath.   Cardiovascular: Negative for chest pain.  Gastrointestinal: Negative for abdominal pain, nausea and vomiting.  Genitourinary: Negative for dysuria and hematuria.  Neurological: Positive for facial asymmetry.  Negative for seizures, weakness and headaches.  All other systems reviewed and are negative.   Physical Exam Updated Vital Signs BP (!) 152/100 (BP Location: Left Arm)   Pulse 60   Temp 99 F (37.2 C)   Resp 18   Ht 5\' 1"  (1.549 m)   Wt 71.7 kg   SpO2 99%   BMI 29.85 kg/m   Physical Exam Vitals and nursing note reviewed.  Constitutional:      Appearance: Normal appearance. She is well-developed.  HENT:     Head: Normocephalic and atraumatic.     Ears:      Comments: Small amount of swelling noted to the left ear.  No overlying warmth, erythema.  TM is normal bilaterally. Eyes:     General: Lids are normal.     Conjunctiva/sclera: Conjunctivae normal.     Pupils: Pupils are equal, round, and reactive to light.     Comments: PERRL. EOMs intact. No nystagmus. No neglect.   Cardiovascular:     Rate and Rhythm: Normal rate and regular rhythm.     Pulses: Normal pulses.     Heart sounds: Normal heart sounds. No murmur heard. No friction rub. No gallop.   Pulmonary:     Effort: Pulmonary effort is normal.     Breath sounds: Normal breath sounds.  Abdominal:     Palpations: Abdomen is soft. Abdomen is not rigid.     Tenderness: There is no abdominal tenderness. There is no guarding.  Musculoskeletal:        General: Normal range of motion.     Cervical back: Full passive range of motion without pain.  Skin:    General: Skin is warm and dry.     Capillary Refill: Capillary refill takes less than 2 seconds.  Neurological:     Mental Status: She is alert and oriented to person, place, and time.     Comments: Left-sided facial droop noted.  Other than cranial nerve VII on the left side, cranial nerves III through XII otherwise intact.  The forehead is involved in her facial paralysis. No pronator drift. No gait ataxia.  5/5 strength of bilateral upper and lower extremities. Sensation intact in all major nerve distributions.  Psychiatric:        Speech: Speech normal.      ED Results / Procedures / Treatments   Labs (all labs ordered are listed, but only abnormal results are displayed) Labs Reviewed  PREGNANCY, URINE  ROCKY MTN SPOTTED FVR ABS PNL(IGG+IGM)  B. BURGDORFI ANTIBODIES    EKG None  Radiology No results found.  Procedures Procedures   Medications Ordered in ED Medications - No data to display  ED Course  I have reviewed the triage vital signs and the nursing notes.  Pertinent labs & imaging results  that were available during my care of the patient were reviewed by me and considered in my medical decision making (see chart for details).  Clinical Course as of 07/23/20 1514  Mon Jul 23, 6172  3130 55 year old female here with 2 days of left facial weakness.  She said she had a little bit of ear pain just before it and had a recent cold sinus infection.  No blurry vision double vision numbness or weakness otherwise.  No prior history of Lyme.  Will get started on antivirals and follow-up neurology. [MB]    Clinical Course User Index [MB] Hayden Rasmussen, MD   MDM Rules/Calculators/A&P                          55 year old female who presents for evaluation of left-sided facial droop that began yesterday.  Reports that when she woke up yesterday, she noticed that left side of her face was drooping.  She had that the day before, she noticed some pain and irritation noted to her left ear she does not know if she got bitten by anything.  She denies any vision changes, fever, numbness/weakness.  On initial arrival, she is afebrile nontoxic-appearing vitals are stable.  On exam, she has obvious left-sided facial droop that involves the forehead consistent with Bell's Palsy.  No other weakness or neurodeficits noted.  She has a small area in the left ear that appears to be slightly swollen.  No overlying warmth, erythema.  Her exam is consistent with Bell's palsy.  She is ambulating normally and shows no other neurodeficits.   History/physical exam not concerning for CVA.  Lyme, RMSF was ordered.  We will plan to treat with antivirals, prednisone, eyedrops.  Patient referred to neuro. Discussed patient with Dr. Melina Copa who is agreeable to plan. At this time, patient exhibits no emergent life-threatening condition that require further evaluation in ED. Patient had ample opportunity for questions and discussion. All patient's questions were answered with full understanding. Strict return precautions discussed. Patient expresses understanding and agreement to plan.    Portions of this note were generated with [employer]. Dictation errors may occur despite best attempts at proofreading.   Final Clinical Impression(s) / ED Diagnoses Final diagnoses:  Bell's palsy    Rx / DC Orders ED Discharge Orders         Ordered    Ambulatory referral to Neurology       Comments: An appointment is requested in approximately: 2 weeks   07/23/20 1453    valACYclovir (VALTREX) 1000 MG tablet  3 times daily        07/23/20 1455    predniSONE (DELTASONE) 20 MG tablet  Daily        07/23/20 1455    artificial tears (LACRILUBE) OINT ophthalmic ointment  At bedtime PRN        07/23/20 1455    hydroxypropyl methylcellulose / hypromellose (ISOPTO TEARS / GONIOVISC) 2.5 % ophthalmic solution  4 times daily PRN        07/23/20 1455           Volanda Napoleon, PA-C 07/23/20 1514    Hayden Rasmussen, MD 07/23/20 1934

## 2020-07-23 NOTE — Discharge Instructions (Addendum)
As we discussed, use artificial teardrops during the day.  At night, he can use the Lacri-Lube.  Take prednisone, Valtrex as directed.  Follow-up with the referred neurologist office.  You have RMSF and Lyme titers pending.  You will be notified of these results.  You can check online or on the MyChart app as well.  Return to emergency department for fever, numbness/weakness of arms or legs, difficulty speaking, chest pain, difficulty breathing or any other worsening concerning symptoms.

## 2020-07-25 ENCOUNTER — Encounter: Payer: Self-pay | Admitting: *Deleted

## 2020-07-25 ENCOUNTER — Ambulatory Visit: Payer: 59 | Admitting: Diagnostic Neuroimaging

## 2020-07-25 ENCOUNTER — Encounter: Payer: Self-pay | Admitting: Diagnostic Neuroimaging

## 2020-07-25 VITALS — BP 126/85 | HR 73 | Ht 61.8 in | Wt 156.6 lb

## 2020-07-25 DIAGNOSIS — G510 Bell's palsy: Secondary | ICD-10-CM | POA: Diagnosis not present

## 2020-07-25 LAB — MISC LABCORP TEST (SEND OUT): Labcorp test code: 164226

## 2020-07-25 LAB — B. BURGDORFI ANTIBODIES

## 2020-07-25 NOTE — Patient Instructions (Signed)
Left bells palsy - continue valacyclovir + prednisone - continue eye care - monitor for improvement over next 4-6 weeks - excuse patient from work from 07/23/20 - 07/27/20 (plan to return to work 07/30/20)

## 2020-07-25 NOTE — Progress Notes (Signed)
[facility]  PATIENT: Jacqueline Baxter DOB: [birth year]  REFERRING CLINICIAN: Minette Brine, FNP HISTORY FROM: patient  REASON FOR VISIT: new consult    HISTORICAL  CHIEF COMPLAINT:  Chief Complaint  Patient presents with  . Bell's Palsy    Rm 6 New Pt, ED referral, " no improvement yet"    HISTORY OF PRESENT ILLNESS:   55 year old female here for evaluation of left Bell's palsy.  07/22/20 patient had onset of left ear pain.  The next day patient noted left facial weakness.  She went to the hospital for evaluation.  She was diagnosed with Bell's palsy and started on acyclovir and prednisone.  No change in taste or hearing.  She has had some slight disequilibrium.  1 to 2 weeks prior to onset of the symptoms she did have upper respiratory infection symptoms with nasal congestion.    REVIEW OF SYSTEMS: Full 14 system review of systems performed and negative with exception of: as per HPI.  ALLERGIES: Allergies  Allergen Reactions  . Penicillins Other (See Comments)    Unknown childhood reaction,swelling    HOME MEDICATIONS: Outpatient Medications Prior to Visit  Medication Sig Dispense Refill  . artificial tears (LACRILUBE) OINT ophthalmic ointment Place into both eyes at bedtime as needed for up to 10 days for dry eyes. 7 g 0  . hydroxypropyl methylcellulose / hypromellose (ISOPTO TEARS / GONIOVISC) 2.5 % ophthalmic solution Place 1 drop into the left eye 4 (four) times daily as needed for dry eyes. 15 mL 12  . Multiple Vitamin (MULTIVITAMIN WITH MINERALS) TABS Take 1 tablet by mouth daily.    Marland Kitchen omeprazole (PRILOSEC) 40 MG capsule Take 40 mg by mouth daily.    . predniSONE (DELTASONE) 20 MG tablet Take 2 tablets (40 mg total) by mouth daily for 4 days. 8 tablet 0  . Probiotic Product (PROBIOTIC DAILY PO) Take 1 capsule by mouth daily at 12 noon.    . valACYclovir (VALTREX) 1000 MG tablet Take 1 tablet (1,000 mg total) by mouth 3 (three) times daily. 21  tablet 0  . Vitamin D, Ergocalciferol, (DRISDOL) 1.25 MG (50000 UNIT) CAPS capsule Take 1 capsule (50,000 Units total) by mouth every 7 (seven) days. 12 capsule 0   No facility-administered medications prior to visit.    PAST MEDICAL HISTORY: Past Medical History:  Diagnosis Date  . Acid reflux   . Arthritis   . Bell's palsy 2022  . Headache(784.0)   . Hyperlipidemia   . Vitamin D deficiency disease     PAST SURGICAL HISTORY: Past Surgical History:  Procedure Laterality Date  . BREAST BIOPSY Right 2017  . CHOLECYSTECTOMY N/A 08/12/2012   Procedure: LAPAROSCOPIC CHOLECYSTECTOMY POSSIBLE IOC;  Surgeon: Harl Bowie, MD;  Location: [facility];  Service: General;  Laterality: N/A;  . TONSILLECTOMY      FAMILY HISTORY: Family History  Problem Relation Age of Onset  . Diabetes Mother     SOCIAL HISTORY: Social History   Socioeconomic History  . Marital status: Single    Spouse name: Not on file  . Number of children: 2  . Years of education: Not on file  . Highest education level: Some college, no degree  Occupational History    Comment: [employer]  Tobacco Use  . Smoking status: Current Every Day Smoker    Packs/day: 0.25    Years: 20.00    Pack years: 5.00    Types: Cigarettes  . Smokeless tobacco: Never Used  . Tobacco comment:  she is smoking 6 - 7 cigarettes a day  Substance and Sexual Activity  . Alcohol use: No    Comment: rare  . Drug use: No  . Sexual activity: Yes    Birth control/protection: I.U.D.  Other Topics Concern  . Not on file  Social History Narrative   Lives with daughter   Caffeine- tea 1 daily   Social Determinants of Health   Financial Resource Strain: Not on file  Food Insecurity: Not on file  Transportation Needs: Not on file  Physical Activity: Not on file  Stress: Not on file  Social Connections: Not on file  Intimate Partner Violence: Not on file     PHYSICAL EXAM  GENERAL EXAM/CONSTITUTIONAL: Vitals:  Vitals:   07/25/20  0908  BP: 126/85  Pulse: 73  Weight: 156 lb 9.6 oz (71 kg)  Height: 5' 1.8" (1.57 m)     Body mass index is 28.83 kg/m. Wt Readings from Last 3 Encounters:  07/25/20 156 lb 9.6 oz (71 kg)  07/23/20 158 lb (71.7 kg)  03/12/20 162 lb 6.4 oz (73.7 kg)     Patient is in no distress; well developed, nourished and groomed; neck is supple  CARDIOVASCULAR:  Examination of carotid arteries is normal; no carotid bruits  Regular rate and rhythm, no murmurs  Examination of peripheral vascular system by observation and palpation is normal  EYES:  Ophthalmoscopic exam of optic discs and posterior segments is normal; no papilledema or hemorrhages  No exam data present  MUSCULOSKELETAL:  Gait, strength, tone, movements noted in Neurologic exam below  NEUROLOGIC: MENTAL STATUS:  No flowsheet data found.  awake, alert, oriented to person, place and time  recent and remote memory intact  normal attention and concentration  language fluent, comprehension intact, naming intact  fund of knowledge appropriate  CRANIAL NERVE:   2nd - no papilledema on fundoscopic exam  2nd, 3rd, 4th, 6th - pupils equal and reactive to light, visual fields full to confrontation, extraocular muscles intact, no nystagmus  5th - facial sensation symmetric  7th - facial strength --> LEFT UPPER AND LOWER FACIAL WEAKNESS; cannot close left eye; minimal movement of left lower face  8th - hearing intact  9th - palate elevates symmetrically, uvula midline  11th - shoulder shrug symmetric  12th - tongue protrusion midline  MOTOR:   normal bulk and tone, full strength in the BUE, BLE  SENSORY:   normal and symmetric to light touch, temperature, vibration  COORDINATION:   finger-nose-finger, fine finger movements normal  REFLEXES:   deep tendon reflexes TRACE and symmetric  GAIT/STATION:   narrow based gait     DIAGNOSTIC DATA (LABS, IMAGING, TESTING) - I reviewed patient  records, labs, notes, testing and imaging myself where available.  Lab Results  Component Value Date   WBC 7.0 01/26/2020   HGB 14.7 01/26/2020   HCT 44.9 01/26/2020   MCV 84 01/26/2020   PLT 285 01/26/2020      Component Value Date/Time   NA 140 01/26/2020 0926   K 4.4 01/26/2020 0926   CL 101 01/26/2020 0926   CO2 24 01/26/2020 0926   GLUCOSE 88 01/26/2020 0926   GLUCOSE 93 10/28/2016 0910   BUN 8 01/26/2020 0926   CREATININE 0.80 01/26/2020 0926   CREATININE 0.69 10/28/2016 0910   CALCIUM 9.5 01/26/2020 0926   PROT 7.1 01/26/2020 0926   ALBUMIN 4.8 01/26/2020 0926   AST 16 01/26/2020 0926   ALT 9 01/26/2020 0926  ALKPHOS 55 01/26/2020 0926   BILITOT 0.3 01/26/2020 0926   GFRNONAA 84 01/26/2020 0926   GFRNONAA 101 10/28/2016 0910   GFRAA 97 01/26/2020 0926   GFRAA 117 10/28/2016 0910   Lab Results  Component Value Date   CHOL 219 (H) 01/26/2020   HDL 67 01/26/2020   LDLCALC 140 (H) 01/26/2020   TRIG 67 01/26/2020   CHOLHDL 3.3 01/26/2020   Lab Results  Component Value Date   HGBA1C 5.9 (H) 01/26/2020   No results found for: VITAMINB12 No results found for: TSH     ASSESSMENT AND PLAN  55 y.o. year old female here with:  Dx:  1. Left-sided Bell's palsy      PLAN:  Left bells palsy (likely post-infx) - continue valacyclovir + prednisone - continue eye care - monitor for improvement over next 4-6 weeks - excuse patient from work from 07/23/20 - 07/27/20 (plan to return to work 07/30/20)  Return for pending if symptoms worsen or fail to improve.    Penni Bombard, MD 06/23/8614, 8:37 AM Certified in Neurology, Neurophysiology and Neuroimaging  [facility] [address] [place], [place] [address] [phone]

## 2020-07-28 LAB — ROCKY MTN SPOTTED FVR ABS PNL(IGG+IGM)
RMSF IgG: NEGATIVE
RMSF IgM: 0.73 index (ref 0.00–0.89)

## 2020-08-01 ENCOUNTER — Other Ambulatory Visit: Payer: Self-pay

## 2020-08-01 ENCOUNTER — Ambulatory Visit: Payer: 59 | Admitting: Nurse Practitioner

## 2020-08-01 VITALS — BP 116/80 | HR 75 | Temp 98.4°F | Ht 61.4 in | Wt 158.8 lb

## 2020-08-01 DIAGNOSIS — G510 Bell's palsy: Secondary | ICD-10-CM | POA: Diagnosis not present

## 2020-08-01 DIAGNOSIS — E559 Vitamin D deficiency, unspecified: Secondary | ICD-10-CM

## 2020-08-01 DIAGNOSIS — R7309 Other abnormal glucose: Secondary | ICD-10-CM

## 2020-08-01 DIAGNOSIS — E7800 Pure hypercholesterolemia, unspecified: Secondary | ICD-10-CM

## 2020-08-01 NOTE — Progress Notes (Signed)
This visit occurred during the SARS-CoV-2 public health emergency.  Safety protocols were in place, including screening questions prior to the visit, additional usage of staff PPE, and extensive cleaning of exam room while observing appropriate contact time as indicated for disinfecting solutions.  Subjective:     Patient ID: Jacqueline Baxter , female    DOB: 08/17/1965 , 55 y.o.   MRN: [id number]   Chief Complaint  Patient presents with   bells palsy    HPI  Patient presents today for bell's palsy. On Saturday morning she had pain to her left ear. Then on Sunday she had puffiness to her left side of her face, then on Monday she went to Urgent care and diagnosed with prednisone and an antiviral. She has completed the dose.     Past Medical History:  Diagnosis Date   Acid reflux    Arthritis    Bell's palsy 2022   Headache(784.0)    Hyperlipidemia    Vitamin D deficiency disease      Family History  Problem Relation Age of Onset   Diabetes Mother      Current Outpatient Medications:    Multiple Vitamin (MULTIVITAMIN WITH MINERALS) TABS, Take 1 tablet by mouth daily., Disp: , Rfl:    omeprazole (PRILOSEC) 40 MG capsule, Take 40 mg by mouth daily., Disp: , Rfl:    Probiotic Product (PROBIOTIC DAILY PO), Take 1 capsule by mouth daily at 12 noon., Disp: , Rfl:    Vitamin D, Ergocalciferol, (DRISDOL) 1.25 MG (50000 UNIT) CAPS capsule, Take 1 capsule (50,000 Units total) by mouth every 7 (seven) days., Disp: 12 capsule, Rfl: 0   hydroxypropyl methylcellulose / hypromellose (ISOPTO TEARS / GONIOVISC) 2.5 % ophthalmic solution, Place 1 drop into the left eye 4 (four) times daily as needed for dry eyes. (Patient not taking: Reported on 08/01/2020), Disp: 15 mL, Rfl: 12   Allergies  Allergen Reactions   Penicillins Other (See Comments)    Unknown childhood reaction,swelling     Review of Systems  Constitutional: Negative.   HENT:         Facial drooping to left side mostly to  forehead area  Respiratory: Negative.    Cardiovascular: Negative.   Neurological:  Negative for dizziness and headaches.  Psychiatric/Behavioral: Negative.      Today's Vitals   08/01/20 1641  BP: 116/80  Pulse: 75  Temp: 98.4 F (36.9 C)  Weight: 158 lb 12.8 oz (72 kg)  Height: 5' 1.4" (1.56 m)  PainSc: 0-No pain   Body mass index is 29.62 kg/m.   Objective:  Physical Exam Constitutional:      General: She is not in acute distress.    Appearance: Normal appearance.  Cardiovascular:     Pulses: Normal pulses.     Heart sounds: Normal heart sounds. No murmur heard. Pulmonary:     Effort: Pulmonary effort is normal. No respiratory distress.     Breath sounds: Normal breath sounds. No wheezing.  Neurological:     General: No focal deficit present.     Mental Status: She is alert and oriented to person, place, and time.     Cranial Nerves: Facial asymmetry (left facial drooping) present. No cranial nerve deficit.     Sensory: Sensation is intact.  Psychiatric:        Mood and Affect: Mood normal.        Behavior: Behavior normal.        Thought Content: Thought content normal.  Judgment: Judgment normal.        Assessment And Plan:     1. Bell's palsy She has completed her antiviral Explained will take time to improve She has already seen neurology as well  2. Vitamin D deficiency Will check vitamin D level and supplement as needed.    Also encouraged to spend 15 minutes in the sun daily.  - VITAMIN D 25 Hydroxy (Vit-D Deficiency, Fractures)  3. Abnormal glucose Chronic, diet controlled Encouraged to avoid sugary foods and drinks - Hemoglobin A1c - BMP8+eGFR  4. Elevated cholesterol Chronic, no current medications Will check lipid panel - Lipid panel    Patient was given opportunity to ask questions. Patient verbalized understanding of the plan and was able to repeat key elements of the plan. All questions were answered to their satisfaction.   Minette Brine, FNP   I, Minette Brine, FNP, have reviewed all documentation for this visit. The documentation on 08/01/20 for the exam, diagnosis, procedures, and orders are all accurate and complete.   IF YOU HAVE BEEN REFERRED TO A SPECIALIST, IT MAY TAKE 1-2 WEEKS TO SCHEDULE/PROCESS THE REFERRAL. IF YOU HAVE NOT HEARD FROM US/SPECIALIST IN TWO WEEKS, PLEASE GIVE [place] A CALL AT [phone].   THE PATIENT IS ENCOURAGED TO PRACTICE SOCIAL DISTANCING DUE TO THE COVID-19 PANDEMIC.

## 2020-08-02 LAB — BMP8+EGFR
BUN/Creatinine Ratio: 17 (ref 9–23)
BUN: 12 mg/dL (ref 6–24)
CO2: 22 mmol/L (ref 20–29)
Calcium: 9 mg/dL (ref 8.7–10.2)
Chloride: 105 mmol/L (ref 96–106)
Creatinine, Ser: 0.72 mg/dL (ref 0.57–1.00)
Glucose: 109 mg/dL — ABNORMAL HIGH (ref 65–99)
Potassium: 4.5 mmol/L (ref 3.5–5.2)
Sodium: 143 mmol/L (ref 134–144)
eGFR: 99 mL/min/{1.73_m2} (ref >59)

## 2020-08-02 LAB — LIPID PANEL
Chol/HDL Ratio: 3.6 ratio (ref 0.0–4.4)
Cholesterol, Total: 202 mg/dL — ABNORMAL HIGH (ref 100–199)
HDL: 56 mg/dL (ref >39)
LDL Chol Calc (NIH): 86 mg/dL (ref 0–99)
Triglycerides: 368 mg/dL — ABNORMAL HIGH (ref 0–149)
VLDL Cholesterol Cal: 60 mg/dL — ABNORMAL HIGH (ref 5–40)

## 2020-08-02 LAB — HEMOGLOBIN A1C
Est. average glucose Bld gHb Est-mCnc: 128 mg/dL
Hgb A1c MFr Bld: 6.1 % — ABNORMAL HIGH (ref 4.8–5.6)

## 2020-08-02 LAB — VITAMIN D 25 HYDROXY (VIT D DEFICIENCY, FRACTURES): Vit D, 25-Hydroxy: 21.2 ng/mL — ABNORMAL LOW (ref 30.0–100.0)

## 2020-08-26 ENCOUNTER — Encounter: Payer: Self-pay | Admitting: Nurse Practitioner

## 2020-08-26 MED ORDER — VITAMIN D (ERGOCALCIFEROL) 1.25 MG (50000 UNIT) PO CAPS
50000.0000 [IU] | ORAL_CAPSULE | ORAL | 1 refills | Status: AC
Start: 2020-08-26 — End: ?

## 2020-08-28 ENCOUNTER — Other Ambulatory Visit: Payer: Self-pay | Admitting: Nurse Practitioner

## 2020-08-28 DIAGNOSIS — R7309 Other abnormal glucose: Secondary | ICD-10-CM

## 2020-08-28 MED ORDER — METFORMIN HCL 500 MG PO TABS
500.0000 mg | ORAL_TABLET | Freq: Two times a day (BID) | ORAL | 2 refills | Status: DC
Start: 2020-08-28 — End: 2022-04-29

## 2020-08-29 ENCOUNTER — Ambulatory Visit: Payer: 59 | Admitting: Diagnostic Neuroimaging

## 2020-08-29 ENCOUNTER — Encounter: Payer: Self-pay | Admitting: Diagnostic Neuroimaging

## 2020-08-29 VITALS — BP 145/92 | HR 78 | Ht 61.0 in | Wt 161.6 lb

## 2020-08-29 DIAGNOSIS — G510 Bell's palsy: Secondary | ICD-10-CM | POA: Diagnosis not present

## 2020-08-29 NOTE — Progress Notes (Signed)
[facility]  PATIENT: Jacqueline Baxter DOB: 1965/03/04  REFERRING CLINICIAN: Minette Brine, FNP HISTORY FROM: patient  REASON FOR VISIT: follow up   HISTORICAL  CHIEF COMPLAINT:  Chief Complaint  Patient presents with   Bell's Palsy    Rm 6,  "no improvement in muscle function, my speech is affected; is it really Bell's palsy    HISTORY OF PRESENT ILLNESS:   UPDATE (08/29/20, VRP): Since last visit, symptoms are stable.  Not much improvement in left facial weakness.  Patient concerned about alternate diagnoses.  Ear pain and tenderness have improved slightly.  Some movement in left face.  PRIOR HPI (07/25/20): 55 year old female here for evaluation of left Bell's palsy.  07/22/20 patient had onset of left ear pain.  The next day patient noted left facial weakness.  She went to the hospital for evaluation.  She was diagnosed with Bell's palsy and started on acyclovir and prednisone.  No change in taste or hearing.  She has had some slight disequilibrium.  1 to 2 weeks prior to onset of the symptoms she did have upper respiratory infection symptoms with nasal congestion.    REVIEW OF SYSTEMS: Full 14 system review of systems performed and negative with exception of: as per HPI.  ALLERGIES: Allergies  Allergen Reactions   Penicillins Other (See Comments)    Unknown childhood reaction,swelling    HOME MEDICATIONS: Outpatient Medications Prior to Visit  Medication Sig Dispense Refill   hydroxypropyl methylcellulose / hypromellose (ISOPTO TEARS / GONIOVISC) 2.5 % ophthalmic solution Place 1 drop into the left eye 4 (four) times daily as needed for dry eyes. 15 mL 12   metFORMIN (GLUCOPHAGE) 500 MG tablet Take 1 tablet (500 mg total) by mouth 2 (two) times daily with a meal. 60 tablet 2   Multiple Vitamin (MULTIVITAMIN WITH MINERALS) TABS Take 1 tablet by mouth daily.     omeprazole (PRILOSEC) 40 MG capsule Take 40 mg by mouth daily.     Probiotic Product  (PROBIOTIC DAILY PO) Take 1 capsule by mouth daily at 12 noon.     Vitamin D, Ergocalciferol, (DRISDOL) 1.25 MG (50000 UNIT) CAPS capsule Take 1 capsule (50,000 Units total) by mouth every 7 (seven) days. 12 capsule 1   No facility-administered medications prior to visit.    PAST MEDICAL HISTORY: Past Medical History:  Diagnosis Date   Acid reflux    Arthritis    Bell's palsy 07/2020   Headache(784.0)    Hyperlipidemia    Vitamin D deficiency disease     PAST SURGICAL HISTORY: Past Surgical History:  Procedure Laterality Date   BREAST BIOPSY Right 2017   CHOLECYSTECTOMY N/A 08/12/2012   Procedure: LAPAROSCOPIC CHOLECYSTECTOMY POSSIBLE IOC;  Surgeon: Harl Bowie, MD;  Location: [facility];  Service: General;  Laterality: N/A;   TONSILLECTOMY      FAMILY HISTORY: Family History  Problem Relation Age of Onset   Diabetes Mother     SOCIAL HISTORY: Social History   Socioeconomic History   Marital status: Single    Spouse name: Not on file   Number of children: 2   Years of education: Not on file   Highest education level: Some college, no degree  Occupational History    Comment: [employer]  Tobacco Use   Smoking status: Every Day    Packs/day: 0.25    Years: 20.00    Pack years: 5.00    Types: Cigarettes   Smokeless tobacco: Never   Tobacco comments:  she is smoking 6 - 7 cigarettes a day  Substance and Sexual Activity   Alcohol use: No    Comment: rare   Drug use: No   Sexual activity: Yes    Birth control/protection: I.U.D.  Other Topics Concern   Not on file  Social History Narrative   Lives with daughter   Caffeine- tea 1 daily   Social Determinants of Health   Financial Resource Strain: Not on file  Food Insecurity: Not on file  Transportation Needs: Not on file  Physical Activity: Not on file  Stress: Not on file  Social Connections: Not on file  Intimate Partner Violence: Not on file     PHYSICAL EXAM  GENERAL EXAM/CONSTITUTIONAL: Vitals:   Vitals:   08/29/20 1415  BP: (!) 145/92  Pulse: 78  Weight: 161 lb 9.6 oz (73.3 kg)  Height: 5\' 1"  (1.549 m)   Body mass index is 30.53 kg/m. Wt Readings from Last 3 Encounters:  08/29/20 161 lb 9.6 oz (73.3 kg)  08/01/20 158 lb 12.8 oz (72 kg)  07/25/20 156 lb 9.6 oz (71 kg)   Patient is in no distress; well developed, nourished and groomed; neck is supple  CARDIOVASCULAR: Examination of carotid arteries is normal; no carotid bruits Regular rate and rhythm, no murmurs Examination of peripheral vascular system by observation and palpation is normal  EYES: Ophthalmoscopic exam of optic discs and posterior segments is normal; no papilledema or hemorrhages No results found.  MUSCULOSKELETAL: Gait, strength, tone, movements noted in Neurologic exam below  NEUROLOGIC: MENTAL STATUS:  No flowsheet data found. awake, alert, oriented to person, place and time recent and remote memory intact normal attention and concentration language fluent, comprehension intact, naming intact fund of knowledge appropriate  CRANIAL NERVE:  2nd - no papilledema on fundoscopic exam 2nd, 3rd, 4th, 6th - pupils equal and reactive to light, visual fields full to confrontation, extraocular muscles intact, no nystagmus 5th - facial sensation symmetric 7th - facial strength --> LEFT UPPER AND LOWER FACIAL WEAKNESS; cannot close left eye; minimal movement of left lower face 8th - hearing intact 9th - palate elevates symmetrically, uvula midline 11th - shoulder shrug symmetric 12th - tongue protrusion midline  MOTOR:  normal bulk and tone, full strength in the BUE, BLE  SENSORY:  normal and symmetric to light touch, temperature, vibration  COORDINATION:  finger-nose-finger, fine finger movements normal  REFLEXES:  deep tendon reflexes TRACE and symmetric  GAIT/STATION:  narrow based gait     DIAGNOSTIC DATA (LABS, IMAGING, TESTING) - I reviewed patient records, labs, notes, testing  and imaging myself where available.  Lab Results  Component Value Date   WBC 7.0 01/26/2020   HGB 14.7 01/26/2020   HCT 44.9 01/26/2020   MCV 84 01/26/2020   PLT 285 01/26/2020      Component Value Date/Time   NA 143 08/01/2020 1709   K 4.5 08/01/2020 1709   CL 105 08/01/2020 1709   CO2 22 08/01/2020 1709   GLUCOSE 109 (H) 08/01/2020 1709   GLUCOSE 93 10/28/2016 0910   BUN 12 08/01/2020 1709   CREATININE 0.72 08/01/2020 1709   CREATININE 0.69 10/28/2016 0910   CALCIUM 9.0 08/01/2020 1709   PROT 7.1 01/26/2020 0926   ALBUMIN 4.8 01/26/2020 0926   AST 16 01/26/2020 0926   ALT 9 01/26/2020 0926   ALKPHOS 55 01/26/2020 0926   BILITOT 0.3 01/26/2020 0926   GFRNONAA 84 01/26/2020 0926   GFRNONAA 101 10/28/2016 0910   GFRAA  97 01/26/2020 0926   GFRAA 117 10/28/2016 0910   Lab Results  Component Value Date   CHOL 202 (H) 08/01/2020   HDL 56 08/01/2020   LDLCALC 86 08/01/2020   TRIG 368 (H) 08/01/2020   CHOLHDL 3.6 08/01/2020   Lab Results  Component Value Date   HGBA1C 6.1 (H) 08/01/2020   No results found for: VITAMINB12 No results found for: TSH     ASSESSMENT AND PLAN  55 y.o. year old female here with:  Dx:  1. Facial nerve palsy       PLAN:  Left facial nerve palsy - check MRI brain (due to lack of improvement) - continue eye care  Orders Placed This Encounter  Procedures   MR BRAIN/IAC W WO CONTRAST   Return for pending test results.    Penni Bombard, MD 9/40/7680, 8:81 PM Certified in Neurology, Neurophysiology and Neuroimaging  [facility] [address] [place], [place] [address] [phone]

## 2020-09-05 ENCOUNTER — Telehealth: Payer: Self-pay | Admitting: Diagnostic Neuroimaging

## 2020-09-05 NOTE — Telephone Encounter (Signed)
Obtained PA for MRI from [facility]. PA #[id number] (09/05/20- 10/20/20). I called patient and LVM regarding scheduling. Requested return call

## 2020-11-29 ENCOUNTER — Other Ambulatory Visit: Payer: Self-pay | Admitting: Nurse Practitioner

## 2020-11-29 DIAGNOSIS — R921 Mammographic calcification found on diagnostic imaging of breast: Secondary | ICD-10-CM

## 2020-12-21 ENCOUNTER — Ambulatory Visit
Admission: RE | Admit: 2020-12-21 | Discharge: 2020-12-21 | Disposition: A | Discharge status: Home / Self Care | Payer: 59 | Source: Ambulatory Visit | Attending: Nurse Practitioner | Admitting: Nurse Practitioner

## 2020-12-21 DIAGNOSIS — R921 Mammographic calcification found on diagnostic imaging of breast: Secondary | ICD-10-CM

## 2021-01-28 ENCOUNTER — Encounter: Payer: 59 | Admitting: Nurse Practitioner

## 2021-02-06 ENCOUNTER — Encounter: Payer: 59 | Admitting: Nurse Practitioner

## 2021-03-20 LAB — HM PAP SMEAR: HM Pap smear: NORMAL

## 2022-01-15 ENCOUNTER — Encounter: Payer: Self-pay | Admitting: Nurse Practitioner

## 2022-01-15 ENCOUNTER — Ambulatory Visit: Payer: 59 | Admitting: Nurse Practitioner

## 2022-01-15 VITALS — BP 138/86 | HR 72 | Temp 98.8°F | Ht 61.0 in | Wt 157.0 lb

## 2022-01-15 DIAGNOSIS — R002 Palpitations: Secondary | ICD-10-CM | POA: Diagnosis not present

## 2022-01-15 DIAGNOSIS — Z2821 Immunization not carried out because of patient refusal: Secondary | ICD-10-CM

## 2022-01-15 DIAGNOSIS — R7309 Other abnormal glucose: Secondary | ICD-10-CM

## 2022-01-15 DIAGNOSIS — E782 Mixed hyperlipidemia: Secondary | ICD-10-CM

## 2022-01-15 DIAGNOSIS — I10 Essential (primary) hypertension: Secondary | ICD-10-CM | POA: Diagnosis not present

## 2022-01-15 MED ORDER — AMLODIPINE BESYLATE 2.5 MG PO TABS: 2.5000 mg | ORAL_TABLET | Freq: Every day | ORAL | 11 refills | Status: DC

## 2022-01-15 NOTE — Progress Notes (Signed)
I,Tianna Badgett,acting as a Education administrator for [employer], FNP.,have documented all relevant documentation on the behalf of Minette Brine, FNP,as directed by  Minette Brine, FNP while in the presence of Minette Brine, [place].  Subjective:     Patient ID: Jacqueline Baxter , female    DOB: Jun 11, 1965 , 56 y.o.   MRN: [id number]   Chief Complaint  Patient presents with   Hypertension    HPI  Patient presents today for elevated blood pressure readings. She had her colonoscopy last week and her blood pressure was up to 200's. She has not been taking any medications for her blood pressure and she has not taken the metformin. She is adopted and is unaware of blood pressure issues with her biological parents.   She has tried chantix in the past for her cigarette smoking quit before increasing the dose.   Hypertension This is a new problem. The current episode started in the past 7 days. The problem is uncontrolled. Associated symptoms include anxiety, headaches and palpitations. Pertinent negatives include no blurred vision, chest pain, shortness of breath or sweats. Associated agents: she has been taking goody powders for her headache - she has been suffering with a toothache, Risk factors for coronary artery disease include stress and smoking/tobacco exposure.     Past Medical History:  Diagnosis Date   Acid reflux    Arthritis    Bell's palsy 07/2020   Headache(784.0)    Hyperlipidemia    Vitamin D deficiency disease      Family History  Problem Relation Age of Onset   Diabetes Mother      Current Outpatient Medications:    amLODipine (NORVASC) 2.5 MG tablet, Take 1 tablet (2.5 mg total) by mouth daily., Disp: 30 tablet, Rfl: 11   benzonatate (TESSALON PERLES) 100 MG capsule, Take 1 capsule (100 mg total) by mouth every 6 (six) hours as needed., Disp: 30 capsule, Rfl: 1   hydroxypropyl methylcellulose / hypromellose (ISOPTO TEARS / GONIOVISC) 2.5 % ophthalmic solution, Place 1 drop into the  left eye 4 (four) times daily as needed for dry eyes., Disp: 15 mL, Rfl: 12   metFORMIN (GLUCOPHAGE) 500 MG tablet, Take 1 tablet (500 mg total) by mouth 2 (two) times daily with a meal., Disp: 60 tablet, Rfl: 2   Multiple Vitamin (MULTIVITAMIN WITH MINERALS) TABS, Take 1 tablet by mouth daily., Disp: , Rfl:    omeprazole (PRILOSEC) 40 MG capsule, Take 40 mg by mouth daily., Disp: , Rfl:    Probiotic Product (PROBIOTIC DAILY PO), Take 1 capsule by mouth daily at 12 noon., Disp: , Rfl:    Vitamin D, Ergocalciferol, (DRISDOL) 1.25 MG (50000 UNIT) CAPS capsule, Take 1 capsule (50,000 Units total) by mouth every 7 (seven) days., Disp: 12 capsule, Rfl: 1   Allergies  Allergen Reactions   Penicillins Other (See Comments)    Unknown childhood reaction,swelling     Review of Systems  Constitutional: Negative.   Eyes:  Negative for blurred vision.  Respiratory: Negative.  Negative for shortness of breath.   Cardiovascular:  Positive for palpitations. Negative for chest pain.  Gastrointestinal: Negative.   Neurological:  Positive for headaches.     Today's Vitals   01/15/22 1019 01/15/22 1104  BP: (!) 148/88 138/86  Pulse: 72   Temp: 98.8 F (37.1 C)   TempSrc: Oral   Weight: 157 lb (71.2 kg)   Height: _0  (1.549 m)    Body mass index is 29.66 kg/m.  Wt Readings  from Last 3 Encounters:  01/20/22 157 lb (71.2 kg)  01/15/22 157 lb (71.2 kg)  08/29/20 161 lb 9.6 oz (73.3 kg)    Objective:  Physical Exam Vitals reviewed.  Constitutional:      General: She is not in acute distress.    Appearance: Normal appearance. She is well-developed and normal weight.  HENT:     Head: Normocephalic and atraumatic.  Eyes:     Pupils: Pupils are equal, round, and reactive to light.  Cardiovascular:     Rate and Rhythm: Normal rate and regular rhythm.     Pulses: Normal pulses.     Heart sounds: Normal heart sounds. No murmur heard. Pulmonary:     Effort: Pulmonary effort is normal. No  respiratory distress.     Breath sounds: Normal breath sounds. No wheezing.  Musculoskeletal:        General: No swelling. Normal range of motion.     Right lower leg: No edema.     Left lower leg: No edema.  Skin:    General: Skin is warm and dry.     Capillary Refill: Capillary refill takes less than 2 seconds.  Neurological:     General: No focal deficit present.     Mental Status: She is alert and oriented to person, place, and time.     Cranial Nerves: No cranial nerve deficit.     Motor: No weakness.  Psychiatric:        Mood and Affect: Mood normal.         Assessment And Plan:     1. Essential hypertension Comments: Blood pressure is fairly controlled. Will add amlodipine, discussed side effects to include swelling to feet. which typically resolves within 2 weeks. - TSH - CMP14+EGFR - amLODipine (NORVASC) 2.5 MG tablet; Take 1 tablet (2.5 mg total) by mouth daily.  Dispense: 30 tablet; Refill: 11 - EKG 12-Lead  2. Palpitations Comments: EKG done with Sinus Bradycardia with HR 58, encouraged to avoid caffeine and to stay well hydrated with water. - EKG 12-Lead  3. Mixed hyperlipidemia Comments: Cholesterol levels were better at last visit, continue to eat a low fat diet. - Lipid panel  4. Abnormal glucose Comments: Diet controlled, encouraged to limit intake of sugary foods and carbs. - Hemoglobin A1c  5. Influenza vaccination declined Patient declined influenza vaccination at this time. Patient is aware that influenza vaccine prevents illness in 70% of healthy people, and reduces hospitalizations to 30-70% in elderly. This vaccine is recommended annually. Education has been provided regarding the importance of this vaccine but patient still declined. Advised may receive this vaccine at local pharmacy or Health Dept.or vaccine clinic. Aware to provide a copy of the vaccination record if obtained from local pharmacy or Health Dept.  Pt is willing to accept risk  associated with refusing vaccination.  6. COVID-19 vaccination declined Declines covid 19 vaccine. Discussed risk of covid 48 and if she changes her mind about the vaccine to call the office. Education has been provided regarding the importance of this vaccine but patient still declined. Advised may receive this vaccine at local pharmacy or Health Dept.or vaccine clinic. Aware to provide a copy of the vaccination record if obtained from local pharmacy or Health Dept.  Encouraged to take multivitamin, vitamin d, vitamin c and zinc to increase immune system. Aware can call office if would like to have vaccine here at office. Verbalized acceptance and understanding.    Patient was given opportunity to ask questions.  Patient verbalized understanding of the plan and was able to repeat key elements of the plan. All questions were answered to their satisfaction.  Minette Brine, FNP   I, Minette Brine, FNP, have reviewed all documentation for this visit. The documentation on 01/15/22 for the exam, diagnosis, procedures, and orders are all accurate and complete.   IF YOU HAVE BEEN REFERRED TO A SPECIALIST, IT MAY TAKE 1-2 WEEKS TO SCHEDULE/PROCESS THE REFERRAL. IF YOU HAVE NOT HEARD FROM US/SPECIALIST IN TWO WEEKS, PLEASE GIVE [place] A CALL AT [phone].   THE PATIENT IS ENCOURAGED TO PRACTICE SOCIAL DISTANCING DUE TO THE COVID-19 PANDEMIC.

## 2022-01-15 NOTE — Patient Instructions (Signed)
Hypertension, Adult High blood pressure (hypertension) is when the force of blood pumping through the arteries is too strong. The arteries are the blood vessels that carry blood from the heart throughout the body. Hypertension forces the heart to work harder to pump blood and may cause arteries to become narrow or stiff. Untreated or uncontrolled hypertension can lead to a heart attack, heart failure, a stroke, kidney disease, and other problems. A blood pressure reading consists of a higher number over a lower number. Ideally, your blood pressure should be below 120/80. The first ("top") number is called the systolic pressure. It is a measure of the pressure in your arteries as your heart beats. The second ("bottom") number is called the diastolic pressure. It is a measure of the pressure in your arteries as the heart relaxes. What are the causes? The exact cause of this condition is not known. There are some conditions that result in high blood pressure. What increases the risk? Certain factors may make you more likely to develop high blood pressure. Some of these risk factors are under your control, including: Smoking. Not getting enough exercise or physical activity. Being overweight. Having too much fat, sugar, calories, or salt (sodium) in your diet. Drinking too much alcohol. Other risk factors include: Having a personal history of heart disease, diabetes, high cholesterol, or kidney disease. Stress. Having a family history of high blood pressure and high cholesterol. Having obstructive sleep apnea. Age. The risk increases with age. What are the signs or symptoms? High blood pressure may not cause symptoms. Very high blood pressure (hypertensive crisis) may cause: Headache. Fast or irregular heartbeats (palpitations). Shortness of breath. Nosebleed. Nausea and vomiting. Vision changes. Severe chest pain, dizziness, and seizures. How is this diagnosed? This condition is diagnosed by  measuring your blood pressure while you are seated, with your arm resting on a flat surface, your legs uncrossed, and your feet flat on the floor. The cuff of the blood pressure monitor will be placed directly against the skin of your upper arm at the level of your heart. Blood pressure should be measured at least twice using the same arm. Certain conditions can cause a difference in blood pressure between your right and left arms. If you have a high blood pressure reading during one visit or you have normal blood pressure with other risk factors, you may be asked to: Return on a different day to have your blood pressure checked again. Monitor your blood pressure at home for 1 week or longer. If you are diagnosed with hypertension, you may have other blood or imaging tests to help your health care provider understand your overall risk for other conditions. How is this treated? This condition is treated by making healthy lifestyle changes, such as eating healthy foods, exercising more, and reducing your alcohol intake. You may be referred for counseling on a healthy diet and physical activity. Your health care provider may prescribe medicine if lifestyle changes are not enough to get your blood pressure under control and if: Your systolic blood pressure is above 130. Your diastolic blood pressure is above 80. Your personal target blood pressure may vary depending on your medical conditions, your age, and other factors. Follow these instructions at home: Eating and drinking  Eat a diet that is high in fiber and potassium, and low in sodium, added sugar, and fat. An example of this eating plan is called the DASH diet. DASH stands for Dietary Approaches to Stop Hypertension. To eat this way: Eat   plenty of fresh fruits and vegetables. Try to fill one half of your plate at each meal with fruits and vegetables. Eat whole grains, such as whole-wheat pasta, brown rice, or whole-grain bread. Fill about one  fourth of your plate with whole grains. Eat or drink low-fat dairy products, such as skim milk or low-fat yogurt. Avoid fatty cuts of meat, processed or cured meats, and poultry with skin. Fill about one fourth of your plate with lean proteins, such as fish, chicken without skin, beans, eggs, or tofu. Avoid pre-made and processed foods. These tend to be higher in sodium, added sugar, and fat. Reduce your daily sodium intake. Many people with hypertension should eat less than 1,500 mg of sodium a day. Do not drink alcohol if: Your health care provider tells you not to drink. You are pregnant, may be pregnant, or are planning to become pregnant. If you drink alcohol: Limit how much you have to: 0-1 drink a day for women. 0-2 drinks a day for men. Know how much alcohol is in your drink. In the [place] one drink equals one 12 oz bottle of beer (355 mL), one 5 oz glass of wine (148 mL), or one 1 oz glass of hard liquor (44 mL). Lifestyle  Work with your health care provider to maintain a healthy body weight or to lose weight. Ask what an ideal weight is for you. Get at least 30 minutes of exercise that causes your heart to beat faster (aerobic exercise) most days of the week. Activities may include walking, swimming, or biking. Include exercise to strengthen your muscles (resistance exercise), such as Pilates or lifting weights, as part of your weekly exercise routine. Try to do these types of exercises for 30 minutes at least 3 days a week. Do not use any products that contain nicotine or tobacco. These products include cigarettes, chewing tobacco, and vaping devices, such as e-cigarettes. If you need help quitting, ask your health care provider. Monitor your blood pressure at home as told by your health care provider. Keep all follow-up visits. This is important. Medicines Take over-the-counter and prescription medicines only as told by your health care provider. Follow directions carefully. Blood  pressure medicines must be taken as prescribed. Do not skip doses of blood pressure medicine. Doing this puts you at risk for problems and can make the medicine less effective. Ask your health care provider about side effects or reactions to medicines that you should watch for. Contact a health care provider if you: Think you are having a reaction to a medicine you are taking. Have headaches that keep coming back (recurring). Feel dizzy. Have swelling in your ankles. Have trouble with your vision. Get help right away if you: Develop a severe headache or confusion. Have unusual weakness or numbness. Feel faint. Have severe pain in your chest or abdomen. Vomit repeatedly. Have trouble breathing. These symptoms may be an emergency. Get help right away. Call 911. Do not wait to see if the symptoms will go away. Do not drive yourself to the hospital. Summary Hypertension is when the force of blood pumping through your arteries is too strong. If this condition is not controlled, it may put you at risk for serious complications. Your personal target blood pressure may vary depending on your medical conditions, your age, and other factors. For most people, a normal blood pressure is less than 120/80. Hypertension is treated with lifestyle changes, medicines, or a combination of both. Lifestyle changes include losing weight, eating a healthy,   low-sodium diet, exercising more, and limiting alcohol. This information is not intended to replace advice given to you by your health care provider. Make sure you discuss any questions you have with your health care provider. Document Revised: 12/11/2020 Document Reviewed: 12/11/2020 Elsevier Patient Education  2023 Elsevier Inc.  

## 2022-01-16 LAB — LIPID PANEL
Chol/HDL Ratio: 3.1 ratio (ref 0.0–4.4)
Cholesterol, Total: 245 mg/dL — ABNORMAL HIGH (ref 100–199)
HDL: 80 mg/dL (ref >39)
LDL Chol Calc (NIH): 154 mg/dL — ABNORMAL HIGH (ref 0–99)
Triglycerides: 68 mg/dL (ref 0–149)
VLDL Cholesterol Cal: 11 mg/dL (ref 5–40)

## 2022-01-16 LAB — CMP14+EGFR
ALT: 11 IU/L (ref 0–32)
AST: 15 IU/L (ref 0–40)
Albumin/Globulin Ratio: 1.9 (ref 1.2–2.2)
Albumin: 4.7 g/dL (ref 3.8–4.9)
Alkaline Phosphatase: 55 IU/L (ref 44–121)
BUN/Creatinine Ratio: 17 (ref 9–23)
BUN: 12 mg/dL (ref 6–24)
Bilirubin Total: 0.3 mg/dL (ref 0.0–1.2)
CO2: 25 mmol/L (ref 20–29)
Calcium: 9.6 mg/dL (ref 8.7–10.2)
Chloride: 101 mmol/L (ref 96–106)
Creatinine, Ser: 0.69 mg/dL (ref 0.57–1.00)
Globulin, Total: 2.5 g/dL (ref 1.5–4.5)
Glucose: 90 mg/dL (ref 70–99)
Potassium: 5.2 mmol/L (ref 3.5–5.2)
Sodium: 139 mmol/L (ref 134–144)
Total Protein: 7.2 g/dL (ref 6.0–8.5)
eGFR: 102 mL/min/{1.73_m2} (ref >59)

## 2022-01-16 LAB — TSH: TSH: 0.676 u[IU]/mL (ref 0.450–4.500)

## 2022-01-16 LAB — HEMOGLOBIN A1C
Est. average glucose Bld gHb Est-mCnc: 131 mg/dL
Hgb A1c MFr Bld: 6.2 % — ABNORMAL HIGH (ref 4.8–5.6)

## 2022-01-20 ENCOUNTER — Ambulatory Visit: Payer: 59 | Admitting: Nurse Practitioner

## 2022-01-20 ENCOUNTER — Encounter: Payer: Self-pay | Admitting: Nurse Practitioner

## 2022-01-20 VITALS — BP 138/78 | HR 126 | Temp 101.3°F | Ht 61.0 in | Wt 157.0 lb

## 2022-01-20 DIAGNOSIS — J101 Influenza due to other identified influenza virus with other respiratory manifestations: Secondary | ICD-10-CM

## 2022-01-20 DIAGNOSIS — R051 Acute cough: Secondary | ICD-10-CM | POA: Diagnosis not present

## 2022-01-20 LAB — POC COVID19 BINAXNOW: SARS Coronavirus 2 Ag: NEGATIVE

## 2022-01-20 LAB — POC INFLUENZA A&B (BINAX/QUICKVUE)
Influenza A, POC: POSITIVE — AB
Influenza B, POC: NEGATIVE

## 2022-01-20 MED ORDER — OSELTAMIVIR PHOSPHATE 75 MG PO CAPS: 75.0000 mg | ORAL_CAPSULE | Freq: Two times a day (BID) | ORAL | 0 refills | Status: AC

## 2022-01-20 MED ORDER — BENZONATATE 100 MG PO CAPS: 100.0000 mg | ORAL_CAPSULE | Freq: Four times a day (QID) | ORAL | 1 refills | Status: DC | PRN

## 2022-01-20 NOTE — Progress Notes (Signed)
I,Tianna Badgett,acting as a Education administrator for [employer], FNP.,have documented all relevant documentation on the behalf of Minette Brine, FNP,as directed by  Minette Brine, FNP while in the presence of Minette Brine, [place].  Subjective:     Patient ID: Jacqueline Baxter , female    DOB: 1965-04-26 , 56 y.o.   MRN: [id number]   Chief Complaint  Patient presents with   Influenza    HPI  Cough since Saturday but body aches, head aches and chills, fever began on Sunday. She has taken mucinex and alka seltzer, nyquil. She works at [employer].      Past Medical History:  Diagnosis Date   Acid reflux    Arthritis    Bell's palsy 07/2020   Headache(784.0)    Hyperlipidemia    Vitamin D deficiency disease      Family History  Problem Relation Age of Onset   Diabetes Mother      Current Outpatient Medications:    benzonatate (TESSALON PERLES) 100 MG capsule, Take 1 capsule (100 mg total) by mouth every 6 (six) hours as needed., Disp: 30 capsule, Rfl: 1   oseltamivir (TAMIFLU) 75 MG capsule, Take 1 capsule (75 mg total) by mouth 2 (two) times daily for 5 days., Disp: 10 capsule, Rfl: 0   amLODipine (NORVASC) 2.5 MG tablet, Take 1 tablet (2.5 mg total) by mouth daily., Disp: 30 tablet, Rfl: 11   hydroxypropyl methylcellulose / hypromellose (ISOPTO TEARS / GONIOVISC) 2.5 % ophthalmic solution, Place 1 drop into the left eye 4 (four) times daily as needed for dry eyes., Disp: 15 mL, Rfl: 12   metFORMIN (GLUCOPHAGE) 500 MG tablet, Take 1 tablet (500 mg total) by mouth 2 (two) times daily with a meal., Disp: 60 tablet, Rfl: 2   Multiple Vitamin (MULTIVITAMIN WITH MINERALS) TABS, Take 1 tablet by mouth daily., Disp: , Rfl:    omeprazole (PRILOSEC) 40 MG capsule, Take 40 mg by mouth daily., Disp: , Rfl:    Probiotic Product (PROBIOTIC DAILY PO), Take 1 capsule by mouth daily at 12 noon., Disp: , Rfl:    Vitamin D, Ergocalciferol, (DRISDOL) 1.25 MG (50000 UNIT) CAPS capsule, Take 1 capsule (50,000  Units total) by mouth every 7 (seven) days., Disp: 12 capsule, Rfl: 1   Allergies  Allergen Reactions   Penicillins Other (See Comments)    Unknown childhood reaction,swelling     Review of Systems  Constitutional:  Positive for fatigue and fever.  HENT:  Positive for congestion.   Respiratory:  Positive for cough.   Cardiovascular: Negative.   Gastrointestinal: Negative.   Neurological: Negative.   Psychiatric/Behavioral: Negative.       Today's Vitals   01/20/22 1551  BP: 138/78  Pulse: (!) 126  Temp: (!) 101.3 F (38.5 C)  TempSrc: Oral  Weight: 157 lb (71.2 kg)  Height: '5\' 1"'$  (1.549 m)   Body mass index is 29.66 kg/m.   Objective:  Physical Exam Vitals reviewed.  Constitutional:      General: She is not in acute distress.    Appearance: Normal appearance.  Cardiovascular:     Pulses: Normal pulses.     Heart sounds: Normal heart sounds. No murmur heard. Pulmonary:     Effort: Pulmonary effort is normal. No respiratory distress.     Breath sounds: Normal breath sounds. No wheezing.  Neurological:     General: No focal deficit present.     Mental Status: She is alert and oriented to person, place, and time.  Cranial Nerves: No cranial nerve deficit.  Psychiatric:        Mood and Affect: Mood normal.        Behavior: Behavior normal.        Thought Content: Thought content normal.        Judgment: Judgment normal.         Assessment And Plan:     1. Influenza A Comments: rapid influenza a positive. Sent Tamiflu to pharmacy. Symptomatic treatment and work note to return on Monday Dec 11th, return call to office if symptoms worsen. If shortness of breath or chest pain go to ER for evaluation - oseltamivir (TAMIFLU) 75 MG capsule; Take 1 capsule (75 mg total) by mouth 2 (two) times daily for 5 days.  Dispense: 10 capsule; Refill: 0 - benzonatate (TESSALON PERLES) 100 MG capsule; Take 1 capsule (100 mg total) by mouth every 6 (six) hours as needed.   Dispense: 30 capsule; Refill: 1  2. Acute cough Negative rapid covid 19 - POC COVID-19 - POC Influenza A&B (Binax test) - benzonatate (TESSALON PERLES) 100 MG capsule; Take 1 capsule (100 mg total) by mouth every 6 (six) hours as needed.  Dispense: 30 capsule; Refill: 1    Patient was given opportunity to ask questions. Patient verbalized understanding of the plan and was able to repeat key elements of the plan. All questions were answered to their satisfaction.  Minette Brine, FNP   I, Minette Brine, FNP, have reviewed all documentation for this visit. The documentation on 01/20/22 for the exam, diagnosis, procedures, and orders are all accurate and complete.   IF YOU HAVE BEEN REFERRED TO A SPECIALIST, IT MAY TAKE 1-2 WEEKS TO SCHEDULE/PROCESS THE REFERRAL. IF YOU HAVE NOT HEARD FROM US/SPECIALIST IN TWO WEEKS, PLEASE GIVE [place] A CALL AT [phone].   THE PATIENT IS ENCOURAGED TO PRACTICE SOCIAL DISTANCING DUE TO THE COVID-19 PANDEMIC.

## 2022-01-20 NOTE — Patient Instructions (Addendum)

## 2022-01-28 ENCOUNTER — Ambulatory Visit: Payer: 59

## 2022-01-28 VITALS — BP 128/70 | HR 75 | Temp 98.8°F

## 2022-01-28 DIAGNOSIS — I10 Essential (primary) hypertension: Secondary | ICD-10-CM

## 2022-01-28 NOTE — Progress Notes (Signed)
Patient presents today for BPC. She is currently taking Amlodipine 2.'5mg'$ .   BP Readings from Last 3 Encounters:  01/28/22 128/70  01/20/22 138/78  01/15/22 138/86   Patient will continue with current medications and follow up with provider as scheduled.

## 2022-01-28 NOTE — Patient Instructions (Signed)
Hypertension, Adult High blood pressure (hypertension) is when the force of blood pumping through the arteries is too strong. The arteries are the blood vessels that carry blood from the heart throughout the body. Hypertension forces the heart to work harder to pump blood and may cause arteries to become narrow or stiff. Untreated or uncontrolled hypertension can lead to a heart attack, heart failure, a stroke, kidney disease, and other problems. A blood pressure reading consists of a higher number over a lower number. Ideally, your blood pressure should be below 120/80. The first ("top") number is called the systolic pressure. It is a measure of the pressure in your arteries as your heart beats. The second ("bottom") number is called the diastolic pressure. It is a measure of the pressure in your arteries as the heart relaxes. What are the causes? The exact cause of this condition is not known. There are some conditions that result in high blood pressure. What increases the risk? Certain factors may make you more likely to develop high blood pressure. Some of these risk factors are under your control, including: Smoking. Not getting enough exercise or physical activity. Being overweight. Having too much fat, sugar, calories, or salt (sodium) in your diet. Drinking too much alcohol. Other risk factors include: Having a personal history of heart disease, diabetes, high cholesterol, or kidney disease. Stress. Having a family history of high blood pressure and high cholesterol. Having obstructive sleep apnea. Age. The risk increases with age. What are the signs or symptoms? High blood pressure may not cause symptoms. Very high blood pressure (hypertensive crisis) may cause: Headache. Fast or irregular heartbeats (palpitations). Shortness of breath. Nosebleed. Nausea and vomiting. Vision changes. Severe chest pain, dizziness, and seizures. How is this diagnosed? This condition is diagnosed by  measuring your blood pressure while you are seated, with your arm resting on a flat surface, your legs uncrossed, and your feet flat on the floor. The cuff of the blood pressure monitor will be placed directly against the skin of your upper arm at the level of your heart. Blood pressure should be measured at least twice using the same arm. Certain conditions can cause a difference in blood pressure between your right and left arms. If you have a high blood pressure reading during one visit or you have normal blood pressure with other risk factors, you may be asked to: Return on a different day to have your blood pressure checked again. Monitor your blood pressure at home for 1 week or longer. If you are diagnosed with hypertension, you may have other blood or imaging tests to help your health care provider understand your overall risk for other conditions. How is this treated? This condition is treated by making healthy lifestyle changes, such as eating healthy foods, exercising more, and reducing your alcohol intake. You may be referred for counseling on a healthy diet and physical activity. Your health care provider may prescribe medicine if lifestyle changes are not enough to get your blood pressure under control and if: Your systolic blood pressure is above 130. Your diastolic blood pressure is above 80. Your personal target blood pressure may vary depending on your medical conditions, your age, and other factors. Follow these instructions at home: Eating and drinking  Eat a diet that is high in fiber and potassium, and low in sodium, added sugar, and fat. An example of this eating plan is called the DASH diet. DASH stands for Dietary Approaches to Stop Hypertension. To eat this way: Eat   plenty of fresh fruits and vegetables. Try to fill one half of your plate at each meal with fruits and vegetables. Eat whole grains, such as whole-wheat pasta, brown rice, or whole-grain bread. Fill about one  fourth of your plate with whole grains. Eat or drink low-fat dairy products, such as skim milk or low-fat yogurt. Avoid fatty cuts of meat, processed or cured meats, and poultry with skin. Fill about one fourth of your plate with lean proteins, such as fish, chicken without skin, beans, eggs, or tofu. Avoid pre-made and processed foods. These tend to be higher in sodium, added sugar, and fat. Reduce your daily sodium intake. Many people with hypertension should eat less than 1,500 mg of sodium a day. Do not drink alcohol if: Your health care provider tells you not to drink. You are pregnant, may be pregnant, or are planning to become pregnant. If you drink alcohol: Limit how much you have to: 0-1 drink a day for women. 0-2 drinks a day for men. Know how much alcohol is in your drink. In the [place] one drink equals one 12 oz bottle of beer (355 mL), one 5 oz glass of wine (148 mL), or one 1 oz glass of hard liquor (44 mL). Lifestyle  Work with your health care provider to maintain a healthy body weight or to lose weight. Ask what an ideal weight is for you. Get at least 30 minutes of exercise that causes your heart to beat faster (aerobic exercise) most days of the week. Activities may include walking, swimming, or biking. Include exercise to strengthen your muscles (resistance exercise), such as Pilates or lifting weights, as part of your weekly exercise routine. Try to do these types of exercises for 30 minutes at least 3 days a week. Do not use any products that contain nicotine or tobacco. These products include cigarettes, chewing tobacco, and vaping devices, such as e-cigarettes. If you need help quitting, ask your health care provider. Monitor your blood pressure at home as told by your health care provider. Keep all follow-up visits. This is important. Medicines Take over-the-counter and prescription medicines only as told by your health care provider. Follow directions carefully. Blood  pressure medicines must be taken as prescribed. Do not skip doses of blood pressure medicine. Doing this puts you at risk for problems and can make the medicine less effective. Ask your health care provider about side effects or reactions to medicines that you should watch for. Contact a health care provider if you: Think you are having a reaction to a medicine you are taking. Have headaches that keep coming back (recurring). Feel dizzy. Have swelling in your ankles. Have trouble with your vision. Get help right away if you: Develop a severe headache or confusion. Have unusual weakness or numbness. Feel faint. Have severe pain in your chest or abdomen. Vomit repeatedly. Have trouble breathing. These symptoms may be an emergency. Get help right away. Call 911. Do not wait to see if the symptoms will go away. Do not drive yourself to the hospital. Summary Hypertension is when the force of blood pumping through your arteries is too strong. If this condition is not controlled, it may put you at risk for serious complications. Your personal target blood pressure may vary depending on your medical conditions, your age, and other factors. For most people, a normal blood pressure is less than 120/80. Hypertension is treated with lifestyle changes, medicines, or a combination of both. Lifestyle changes include losing weight, eating a healthy,   low-sodium diet, exercising more, and limiting alcohol. This information is not intended to replace advice given to you by your health care provider. Make sure you discuss any questions you have with your health care provider. Document Revised: 12/11/2020 Document Reviewed: 12/11/2020 Elsevier Patient Education  2023 Elsevier Inc.  

## 2022-02-02 ENCOUNTER — Encounter: Payer: Self-pay | Admitting: Nurse Practitioner

## 2022-04-24 ENCOUNTER — Other Ambulatory Visit: Payer: Self-pay

## 2022-04-24 DIAGNOSIS — R1013 Epigastric pain: Secondary | ICD-10-CM | POA: Insufficient documentation

## 2022-04-24 DIAGNOSIS — R140 Abdominal distension (gaseous): Secondary | ICD-10-CM | POA: Insufficient documentation

## 2022-04-24 DIAGNOSIS — R141 Gas pain: Secondary | ICD-10-CM | POA: Insufficient documentation

## 2022-04-24 DIAGNOSIS — R110 Nausea: Secondary | ICD-10-CM | POA: Insufficient documentation

## 2022-04-24 DIAGNOSIS — R195 Other fecal abnormalities: Secondary | ICD-10-CM | POA: Insufficient documentation

## 2022-04-24 DIAGNOSIS — Z716 Tobacco abuse counseling: Secondary | ICD-10-CM | POA: Insufficient documentation

## 2022-04-24 DIAGNOSIS — K219 Gastro-esophageal reflux disease without esophagitis: Secondary | ICD-10-CM | POA: Insufficient documentation

## 2022-04-24 DIAGNOSIS — R1011 Right upper quadrant pain: Secondary | ICD-10-CM | POA: Insufficient documentation

## 2022-04-24 DIAGNOSIS — R635 Abnormal weight gain: Secondary | ICD-10-CM

## 2022-04-24 DIAGNOSIS — R5383 Other fatigue: Secondary | ICD-10-CM | POA: Insufficient documentation

## 2022-04-24 DIAGNOSIS — R0683 Snoring: Secondary | ICD-10-CM | POA: Insufficient documentation

## 2022-04-24 DIAGNOSIS — R1310 Dysphagia, unspecified: Secondary | ICD-10-CM | POA: Insufficient documentation

## 2022-04-24 HISTORY — DX: Abnormal weight gain: R63.5

## 2022-04-24 HISTORY — DX: Abdominal distension (gaseous): R14.0

## 2022-04-29 ENCOUNTER — Encounter: Payer: Self-pay | Admitting: Nurse Practitioner

## 2022-04-29 ENCOUNTER — Ambulatory Visit (INDEPENDENT_AMBULATORY_CARE_PROVIDER_SITE_OTHER): Payer: 59 | Admitting: Nurse Practitioner

## 2022-04-29 VITALS — BP 118/62 | HR 91 | Temp 98.5°F | Ht 61.0 in | Wt 153.0 lb

## 2022-04-29 DIAGNOSIS — Z0000 Encounter for general adult medical examination without abnormal findings: Secondary | ICD-10-CM | POA: Diagnosis not present

## 2022-04-29 DIAGNOSIS — E782 Mixed hyperlipidemia: Secondary | ICD-10-CM

## 2022-04-29 DIAGNOSIS — R053 Chronic cough: Secondary | ICD-10-CM

## 2022-04-29 DIAGNOSIS — G510 Bell's palsy: Secondary | ICD-10-CM

## 2022-04-29 DIAGNOSIS — K029 Dental caries, unspecified: Secondary | ICD-10-CM

## 2022-04-29 DIAGNOSIS — Z23 Encounter for immunization: Secondary | ICD-10-CM | POA: Diagnosis not present

## 2022-04-29 DIAGNOSIS — E559 Vitamin D deficiency, unspecified: Secondary | ICD-10-CM | POA: Diagnosis not present

## 2022-04-29 DIAGNOSIS — I10 Essential (primary) hypertension: Secondary | ICD-10-CM

## 2022-04-29 DIAGNOSIS — R911 Solitary pulmonary nodule: Secondary | ICD-10-CM

## 2022-04-29 DIAGNOSIS — R7309 Other abnormal glucose: Secondary | ICD-10-CM | POA: Diagnosis not present

## 2022-04-29 NOTE — Patient Instructions (Signed)
[phone] - Dr Leta Baptist for the [facility] - you did not have your MRI done in 2022

## 2022-04-29 NOTE — Progress Notes (Addendum)
I,Sheena H Holbrook,acting as a Education administrator for Minette Brine, FNP.,have documented all relevant documentation on the behalf of Minette Brine, FNP,as directed by  Minette Brine, FNP while in the presence of Minette Brine, [place].    Subjective:     Patient ID: Jacqueline Baxter , female    DOB: [birth year] , 57 y.o.   MRN: [id number]   Chief Complaint  Patient presents with   Annual Exam    HPI  Patient presents today for hypertension follow up. Patient declines further COVID vaccines at this time. Patient would like the shingles vaccine. Patient is unsure of last TDaP. Request sent to Gyn for most recent PAP; patient due for mammo 12/2022.   Wt Readings from Last 3 Encounters: 04/29/22 : 153 lb (69.4 kg) 01/20/22 : 157 lb (71.2 kg) 01/15/22 : 157 lb (71.2 kg)      Past Medical History:  Diagnosis Date   Acid reflux    Arthritis    Bell's palsy 07/2020   Headache(784.0)    Hyperlipidemia    Vitamin D deficiency disease      Family History  Problem Relation Age of Onset   Diabetes Mother      Current Outpatient Medications:    amLODipine (NORVASC) 2.5 MG tablet, Take 1 tablet (2.5 mg total) by mouth daily., Disp: 30 tablet, Rfl: 11   hydroxypropyl methylcellulose / hypromellose (ISOPTO TEARS / GONIOVISC) 2.5 % ophthalmic solution, Place 1 drop into the left eye 4 (four) times daily as needed for dry eyes., Disp: 15 mL, Rfl: 12   Multiple Vitamin (MULTIVITAMIN WITH MINERALS) TABS, Take 1 tablet by mouth daily., Disp: , Rfl:    omeprazole (PRILOSEC) 40 MG capsule, Take 40 mg by mouth daily., Disp: , Rfl:    Probiotic Product (PROBIOTIC DAILY PO), Take 1 capsule by mouth daily at 12 noon., Disp: , Rfl:    Vitamin D, Ergocalciferol, (DRISDOL) 1.25 MG (50000 UNIT) CAPS capsule, Take 1 capsule (50,000 Units total) by mouth every 7 (seven) days., Disp: 12 capsule, Rfl: 1   cyclobenzaprine (FLEXERIL) 10 MG tablet, Take 1 tablet (10 mg total) by mouth 3 (three) times daily as needed for  muscle spasms., Disp: 30 tablet, Rfl: 0   Allergies  Allergen Reactions   Penicillins Other (See Comments)    Unknown childhood reaction,swelling      The patient states she uses post menopausal status for birth control. Has not had a menstrual cycle since having the mirena which was removed 2 years ago.  . Negative for Dysmenorrhea and Negative for Menorrhagia. Negative for: breast discharge, breast lump(s), breast pain and breast self exam. Associated symptoms include abnormal vaginal bleeding. Pertinent negatives include abnormal bleeding (hematology), anxiety, decreased libido, depression, difficulty falling sleep, dyspareunia, history of infertility, nocturia, sexual dysfunction, sleep disturbances, urinary incontinence, urinary urgency, vaginal discharge and vaginal itching. Diet regular; does admit it can be better; tries to avoid sugary drinks and foods during the week. She does eat an increased amount of processed foods.The patient states her exercise level is moderate with 3-4 days a week.   The patient's tobacco use is:  Social History   Tobacco Use  Smoking Status Every Day   Packs/day: 0.25   Years: 20.00   Additional pack years: 0.00   Total pack years: 5.00   Types: Cigarettes  Smokeless Tobacco Never  Tobacco Comments   she is smoking 6 - 7 cigarettes a day, 11/29 - smoking 5 cigs a day   She has been  exposed to passive smoke. The patient's alcohol use is:  Social History   Substance and Sexual Activity  Alcohol Use No   Comment: rare  Additional information: Last pap 2023 - will get records from [facility], next one scheduled for 2026.    Review of Systems  Constitutional: Negative.   HENT: Negative.    Eyes: Negative.   Respiratory: Negative.  Negative for cough.   Cardiovascular: Negative.  Negative for chest pain, palpitations and leg swelling.  Gastrointestinal: Negative.   Endocrine: Negative.   Genitourinary: Negative.   Musculoskeletal:  Negative.   Skin: Negative.   Neurological:  Negative for dizziness and headaches.  Hematological: Negative.   Psychiatric/Behavioral: Negative.       Today's Vitals   04/29/22 1449  BP: 118/62  Pulse: 91  Temp: 98.5 F (36.9 C)  TempSrc: Oral  SpO2: 97%  Weight: 153 lb (69.4 kg)  Height: 5\' 1"  (1.549 m)   Body mass index is 28.91 kg/m.   Objective:  Physical Exam Vitals reviewed.  Constitutional:      General: She is not in acute distress.    Appearance: Normal appearance. She is well-developed.  HENT:     Head: Normocephalic and atraumatic.     Right Ear: Hearing, tympanic membrane, ear canal and external ear normal. There is no impacted cerumen.     Left Ear: Hearing, tympanic membrane, ear canal and external ear normal. There is no impacted cerumen.     Nose:     Comments: Deferred - masked    Mouth/Throat:     Comments: Deferred - masked Eyes:     General: Lids are normal.     Extraocular Movements: Extraocular movements intact.     Conjunctiva/sclera: Conjunctivae normal.     Pupils: Pupils are equal, round, and reactive to light.     Funduscopic exam:    Right eye: No papilledema.        Left eye: No papilledema.  Neck:     Thyroid: No thyroid mass.     Vascular: No carotid bruit.  Cardiovascular:     Rate and Rhythm: Normal rate and regular rhythm.     Pulses: Normal pulses.     Heart sounds: Normal heart sounds. No murmur heard. Pulmonary:     Effort: Pulmonary effort is normal. No respiratory distress.     Breath sounds: Normal breath sounds. No wheezing.  Chest:     Chest wall: No mass.  Breasts:    Tanner Score is 5.     Right: Normal. No mass or tenderness.     Left: Normal. No mass or tenderness.  Abdominal:     General: Abdomen is flat. Bowel sounds are normal. There is no distension.     Palpations: Abdomen is soft.     Tenderness: There is no abdominal tenderness.  Genitourinary:    Rectum: Guaiac result negative.  Musculoskeletal:         General: No swelling. Normal range of motion.     Cervical back: Full passive range of motion without pain, normal range of motion and neck supple.     Right lower leg: No edema.     Left lower leg: No edema.  Lymphadenopathy:     Upper Body:     Right upper body: No supraclavicular, axillary or pectoral adenopathy.     Left upper body: No supraclavicular, axillary or pectoral adenopathy.  Skin:    General: Skin is warm and dry.  Capillary Refill: Capillary refill takes less than 2 seconds.  Neurological:     General: No focal deficit present.     Mental Status: She is alert and oriented to person, place, and time.     Cranial Nerves: No cranial nerve deficit.     Sensory: No sensory deficit.  Psychiatric:        Mood and Affect: Mood normal.        Behavior: Behavior normal.        Thought Content: Thought content normal.        Judgment: Judgment normal.         Assessment And Plan:     1. Encounter for health maintenance examination Behavior modifications discussed and diet history reviewed.   Pt will continue to exercise regularly and modify diet with low GI, plant based foods and decrease intake of processed foods.  Recommend intake of daily multivitamin, Vitamin D, and calcium.  Recommend mammogram and colonoscopy for preventive screenings, as well as recommend immunizations that include influenza, TDAP, and Shingles (done today)  2. Need for zoster vaccination Shingrix #1 done return in 2-6 months for 2nd vaccine - Zoster Recombinant (Shingrix )  3. Essential hypertension Comments: Blood pressure is well controlled. Continue current medications. - CBC - CMP14+EGFR - POCT Urinalysis Dipstick (81002)  4. Mixed hyperlipidemia Comments: Stable, continue focusing on healthy diet - Lipid panel  5. Abnormal glucose Comments: Stable, diet controlled. Continue current regimen. - Hemoglobin A1c  6. Vitamin D deficiency Will check vitamin D level and supplement  as needed.    Also encouraged to spend 15 minutes in the sun daily.  - VITAMIN D 25 Hydroxy (Vit-D Deficiency, Fractures)  7. Persistent cough Will check CT scan chest.   8. Bell's palsy Comments: She is advised to f/u with Neurology.  9. Dental caries Comments: right upper molar is decayed and embedded in gum. She is advised to follow up with dentist  10. Lung nodule Comments: patient does not have a lung nodule, placed in chart in error. Will have low dose CT scan lung cancelled.  - CT CHEST LUNG CA SCREEN LOW DOSE W/O CM; Future   Patient was given opportunity to ask questions. Patient verbalized understanding of the plan and was able to repeat key elements of the plan. All questions were answered to their satisfaction.   Arnette Felts, FNP   I, Arnette Felts, FNP, have reviewed all documentation for this visit. The documentation on 04/29/22 for the exam, diagnosis, procedures, and orders are all accurate and complete.   THE PATIENT IS ENCOURAGED TO PRACTICE SOCIAL DISTANCING DUE TO THE COVID-19 PANDEMIC.

## 2022-04-30 LAB — CMP14+EGFR
ALT: 13 IU/L (ref 0–32)
AST: 18 IU/L (ref 0–40)
Albumin/Globulin Ratio: 1.7 (ref 1.2–2.2)
Albumin: 4.5 g/dL (ref 3.8–4.9)
Alkaline Phosphatase: 60 IU/L (ref 44–121)
BUN/Creatinine Ratio: 11 (ref 9–23)
BUN: 10 mg/dL (ref 6–24)
Bilirubin Total: 0.3 mg/dL (ref 0.0–1.2)
CO2: 24 mmol/L (ref 20–29)
Calcium: 9.5 mg/dL (ref 8.7–10.2)
Chloride: 104 mmol/L (ref 96–106)
Creatinine, Ser: 0.87 mg/dL (ref 0.57–1.00)
Globulin, Total: 2.7 g/dL (ref 1.5–4.5)
Glucose: 76 mg/dL (ref 70–99)
Potassium: 4.6 mmol/L (ref 3.5–5.2)
Sodium: 142 mmol/L (ref 134–144)
Total Protein: 7.2 g/dL (ref 6.0–8.5)
eGFR: 78 mL/min/{1.73_m2} (ref >59)

## 2022-04-30 LAB — CBC
Hematocrit: 37.8 % (ref 34.0–46.6)
Hemoglobin: 12.7 g/dL (ref 11.1–15.9)
MCH: 27.8 pg (ref 26.6–33.0)
MCHC: 33.6 g/dL (ref 31.5–35.7)
MCV: 83 fL (ref 79–97)
Platelets: 285 10*3/uL (ref 150–450)
RBC: 4.57 x10E6/uL (ref 3.77–5.28)
RDW: 13.3 % (ref 11.7–15.4)
WBC: 7.5 10*3/uL (ref 3.4–10.8)

## 2022-04-30 LAB — VITAMIN D 25 HYDROXY (VIT D DEFICIENCY, FRACTURES): Vit D, 25-Hydroxy: 26.7 ng/mL — ABNORMAL LOW (ref 30.0–100.0)

## 2022-04-30 LAB — LIPID PANEL
Chol/HDL Ratio: 2.8 ratio (ref 0.0–4.4)
Cholesterol, Total: 220 mg/dL — ABNORMAL HIGH (ref 100–199)
HDL: 78 mg/dL (ref >39)
LDL Chol Calc (NIH): 127 mg/dL — ABNORMAL HIGH (ref 0–99)
Triglycerides: 87 mg/dL (ref 0–149)
VLDL Cholesterol Cal: 15 mg/dL (ref 5–40)

## 2022-04-30 LAB — HEMOGLOBIN A1C
Est. average glucose Bld gHb Est-mCnc: 120 mg/dL
Hgb A1c MFr Bld: 5.8 % — ABNORMAL HIGH (ref 4.8–5.6)

## 2022-05-01 ENCOUNTER — Other Ambulatory Visit: Payer: Self-pay | Admitting: Nurse Practitioner

## 2022-05-01 DIAGNOSIS — G510 Bell's palsy: Secondary | ICD-10-CM

## 2022-05-01 LAB — POCT URINALYSIS DIPSTICK
Bilirubin, UA: NEGATIVE
Blood, UA: NEGATIVE
Glucose, UA: POSITIVE — AB
Ketones, UA: NEGATIVE
Leukocytes, UA: NEGATIVE
Nitrite, UA: NEGATIVE
Protein, UA: NEGATIVE
Spec Grav, UA: 1.02 (ref 1.010–1.025)
Urobilinogen, UA: 0.2 U/dL
pH, UA: 5.5 (ref 5.0–8.0)

## 2022-05-06 ENCOUNTER — Other Ambulatory Visit: Payer: Self-pay | Admitting: Nurse Practitioner

## 2022-05-06 DIAGNOSIS — G510 Bell's palsy: Secondary | ICD-10-CM

## 2022-05-06 MED ORDER — CYCLOBENZAPRINE HCL 10 MG PO TABS: 10.0000 mg | ORAL_TABLET | Freq: Three times a day (TID) | ORAL | 0 refills | Status: DC | PRN

## 2022-05-14 ENCOUNTER — Ambulatory Visit: Payer: 59 | Admitting: Adult Health

## 2022-05-14 ENCOUNTER — Encounter: Payer: Self-pay | Admitting: Adult Health

## 2022-05-14 VITALS — BP 141/90 | HR 79 | Ht 61.0 in | Wt 157.0 lb

## 2022-05-14 DIAGNOSIS — G510 Bell's palsy: Secondary | ICD-10-CM

## 2022-05-14 NOTE — Progress Notes (Signed)
PATIENT: Jacqueline Baxter DOB: 05-Jun-1965  REASON FOR VISIT: follow up HISTORY FROM: patient PRIMARY NEUROLOGIST: Dr. Leta Baptist  Chief Complaint  Patient presents with   Follow-up    Pt in 63 Pt here for Bells Palsey f/u Pt has face droop to right side of face Pt states has trouble eating, and talking      HISTORY OF PRESENT ILLNESS: Today 05/14/22  Jacqueline Baxter is a 57 y.o. female who has been followed in this office for Bell's Palsy. Returns today for follow-up.  She was last seen in 2022.  At that time an MRI of the brain was ordered as the patient's symptoms had not improved much.  She did not have the MRI.  She states that overall she may have had slight improvement but everything has remained relatively the same.  She is able to close her eyes completely.  She returns today for an evaluation.  HISTORY UPDATE (08/29/20, VRP): Since last visit, symptoms are stable.  Not much improvement in left facial weakness.  Patient concerned about alternate diagnoses.  Ear pain and tenderness have improved slightly.  Some movement in left face.   PRIOR HPI (07/25/20): 57 year old female here for evaluation of left Bell's palsy.   07/22/20 patient had onset of left ear pain.  The next day patient noted left facial weakness.  She went to the hospital for evaluation.  She was diagnosed with Bell's palsy and started on acyclovir and prednisone.  No change in taste or hearing.  She has had some slight disequilibrium.  1 to 2 weeks prior to onset of the symptoms she did have upper respiratory infection symptoms with nasal congestion.      REVIEW OF SYSTEMS: Out of a complete 14 system review of symptoms, the patient complains only of the following symptoms, and all other reviewed systems are negative.  ALLERGIES: Allergies  Allergen Reactions   Penicillins Other (See Comments)    Unknown childhood reaction,swelling    HOME MEDICATIONS: Outpatient Medications Prior to Visit  Medication Sig  Dispense Refill   amLODipine (NORVASC) 2.5 MG tablet Take 1 tablet (2.5 mg total) by mouth daily. 30 tablet 11   cyclobenzaprine (FLEXERIL) 10 MG tablet Take 1 tablet (10 mg total) by mouth 3 (three) times daily as needed for muscle spasms. 30 tablet 0   hydroxypropyl methylcellulose / hypromellose (ISOPTO TEARS / GONIOVISC) 2.5 % ophthalmic solution Place 1 drop into the left eye 4 (four) times daily as needed for dry eyes. 15 mL 12   Multiple Vitamin (MULTIVITAMIN WITH MINERALS) TABS Take 1 tablet by mouth daily.     omeprazole (PRILOSEC) 40 MG capsule Take 40 mg by mouth daily.     Probiotic Product (PROBIOTIC DAILY PO) Take 1 capsule by mouth daily at 12 noon.     Vitamin D, Ergocalciferol, (DRISDOL) 1.25 MG (50000 UNIT) CAPS capsule Take 1 capsule (50,000 Units total) by mouth every 7 (seven) days. 12 capsule 1   No facility-administered medications prior to visit.    PAST MEDICAL HISTORY: Past Medical History:  Diagnosis Date   Acid reflux    Arthritis    Bell's palsy 07/2020   Headache(784.0)    Hyperlipidemia    Vitamin D deficiency disease     PAST SURGICAL HISTORY: Past Surgical History:  Procedure Laterality Date   BREAST BIOPSY Right 2017   CHOLECYSTECTOMY N/A 08/12/2012   Procedure: LAPAROSCOPIC CHOLECYSTECTOMY POSSIBLE IOC;  Surgeon: Harl Bowie, MD;  Location: [facility];  Service: General;  Laterality: N/A;   TONSILLECTOMY      FAMILY HISTORY: Family History  Problem Relation Age of Onset   Diabetes Mother    Bell's palsy Neg Hx     SOCIAL HISTORY: Social History   Socioeconomic History   Marital status: Single    Spouse name: Not on file   Number of children: 2   Years of education: Not on file   Highest education level: Some college, no degree  Occupational History    Comment: [employer]  Tobacco Use   Smoking status: Every Day    Packs/day: 0.25    Years: 20.00    Additional pack years: 0.00    Total pack years: 5.00    Types: Cigarettes    Smokeless tobacco: Never   Tobacco comments:    she is smoking 6 - 7 cigarettes a day, 11/29 - smoking 5 cigs a day  Substance and Sexual Activity   Alcohol use: Yes    Comment: occ   Drug use: No   Sexual activity: Yes    Birth control/protection: I.U.D.  Other Topics Concern   Not on file  Social History Narrative   Lives with daughter   Caffeine- tea 1 daily   Social Determinants of Health   Financial Resource Strain: Not on file  Food Insecurity: Not on file  Transportation Needs: Not on file  Physical Activity: Not on file  Stress: Not on file  Social Connections: Not on file  Intimate Partner Violence: Not on file      PHYSICAL EXAM  Vitals:   05/14/22 0929  BP: (!) 141/90  Pulse: 79  Weight: 157 lb (71.2 kg)  Height: 5\' 1"  (1.549 m)   Body mass index is 29.66 kg/m.  Generalized: Well developed, in no acute distress   Neurological examination  Mentation: Alert oriented to time, place, history taking. Follows all commands speech and language fluent Cranial nerve II-XII: Pupils were equal round reactive to light. Extraocular movements were full, visual field were full on confrontational test.  Mild left upper facial weakness.  Left lower facial weakness. uvula tongue midline. Head turning and shoulder shrug  were normal and symmetric. Motor: The motor testing reveals 5 over 5 strength of all 4 extremities. Good symmetric motor tone is noted throughout.  Sensory: Sensory testing is intact to soft touch on all 4 extremities. No evidence of extinction is noted.  Coordination: Cerebellar testing reveals good finger-nose-finger and heel-to-shin bilaterally.  Gait and station: Gait is normal.    DIAGNOSTIC DATA (LABS, IMAGING, TESTING) - I reviewed patient records, labs, notes, testing and imaging myself where available.  Lab Results  Component Value Date   WBC 7.5 04/29/2022   HGB 12.7 04/29/2022   HCT 37.8 04/29/2022   MCV 83 04/29/2022   PLT 285 04/29/2022       Component Value Date/Time   NA 142 04/29/2022 1538   K 4.6 04/29/2022 1538   CL 104 04/29/2022 1538   CO2 24 04/29/2022 1538   GLUCOSE 76 04/29/2022 1538   GLUCOSE 93 10/28/2016 0910   BUN 10 04/29/2022 1538   CREATININE 0.87 04/29/2022 1538   CREATININE 0.69 10/28/2016 0910   CALCIUM 9.5 04/29/2022 1538   PROT 7.2 04/29/2022 1538   ALBUMIN 4.5 04/29/2022 1538   AST 18 04/29/2022 1538   ALT 13 04/29/2022 1538   ALKPHOS 60 04/29/2022 1538   BILITOT 0.3 04/29/2022 1538   GFRNONAA 84 01/26/2020 0926   GFRNONAA 101 10/28/2016  [place] 01/26/2020 0926   GFRAA 117 10/28/2016 0910   Lab Results  Component Value Date   CHOL 220 (H) 04/29/2022   HDL 78 04/29/2022   LDLCALC 127 (H) 04/29/2022   TRIG 87 04/29/2022   CHOLHDL 2.8 04/29/2022   Lab Results  Component Value Date   HGBA1C 5.8 (H) 04/29/2022   No results found for: "VITAMINB12" Lab Results  Component Value Date   TSH 0.676 01/15/2022      ASSESSMENT AND PLAN 57 y.o. year old female  has a past medical history of Acid reflux, Arthritis, Bell's palsy (07/2020), Headache(784.0), Hyperlipidemia, and Vitamin D deficiency disease. here with:  1.  Facial nerve palsy  -MRI of the brain with and without contrast ordered -Physical therapy ordered -Follow-up if needed pending MRI results     Ward Givens, MSN, NP-C 05/14/2022, 9:34 AM [facility] [address] [place], [place] [address] [phone]

## 2022-05-21 ENCOUNTER — Telehealth: Payer: Self-pay | Admitting: Adult Health

## 2022-05-21 ENCOUNTER — Ambulatory Visit: Payer: 59

## 2022-05-21 MED ORDER — ALPRAZOLAM 0.5 MG PO TABS: ORAL_TABLET | ORAL | 0 refills | Status: AC

## 2022-05-21 NOTE — Addendum Note (Signed)
Addended by: Andrey Spearman R on: 05/21/2022 02:36 PM   Modules accepted: Orders

## 2022-05-21 NOTE — Telephone Encounter (Signed)
Meds ordered this encounter  Medications   ALPRAZolam (XANAX) 0.5 MG tablet    Sig: for sedation before MRI scan; take 1 tab 1 hour before scan; may repeat 1 tab 15 min before scan    Dispense:  3 tablet    Refill:  0    Penni Bombard, MD [id number], [id number] PM Certified in Neurology, Neurophysiology and Neuroimaging  [facility] [address], [place], [place] [address] [phone]

## 2022-05-21 NOTE — Telephone Encounter (Signed)
Pt scheduled for MR brain w/wo contrast at [facility] for 05/27/22 at 11am. Pt is requesting medication to help with claustrophobia, pt informed to have driver for appointment.  [place] Kemp [phone]

## 2022-05-23 ENCOUNTER — Encounter: Payer: Self-pay | Admitting: Adult Health

## 2022-05-26 ENCOUNTER — Other Ambulatory Visit: Payer: Self-pay | Admitting: Adult Health

## 2022-05-26 DIAGNOSIS — G510 Bell's palsy: Secondary | ICD-10-CM

## 2022-05-27 ENCOUNTER — Other Ambulatory Visit: Payer: Self-pay | Admitting: Nurse Practitioner

## 2022-05-27 ENCOUNTER — Ambulatory Visit (INDEPENDENT_AMBULATORY_CARE_PROVIDER_SITE_OTHER): Payer: 59

## 2022-05-27 DIAGNOSIS — G510 Bell's palsy: Secondary | ICD-10-CM | POA: Diagnosis not present

## 2022-05-27 DIAGNOSIS — R053 Chronic cough: Secondary | ICD-10-CM

## 2022-05-27 MED ORDER — GADOBENATE DIMEGLUMINE 529 MG/ML IV SOLN
15.0000 mL | Freq: Once | INTRAVENOUS | Status: AC | PRN
  Administered 2022-05-27: 15 mL via INTRAVENOUS

## 2022-05-28 ENCOUNTER — Ambulatory Visit
Admission: RE | Admit: 2022-05-28 | Discharge: 2022-05-28 | Disposition: A | Discharge status: Home / Self Care | Payer: 59 | Source: Ambulatory Visit | Attending: Nurse Practitioner | Admitting: Nurse Practitioner

## 2022-05-28 DIAGNOSIS — R911 Solitary pulmonary nodule: Secondary | ICD-10-CM

## 2022-06-30 ENCOUNTER — Institutional Professional Consult (permissible substitution): Payer: 59 | Admitting: Diagnostic Neuroimaging

## 2022-07-17 ENCOUNTER — Encounter: Payer: Self-pay | Admitting: Nurse Practitioner

## 2022-09-02 ENCOUNTER — Encounter: Payer: Self-pay | Admitting: Nurse Practitioner

## 2022-09-19 LAB — HM COLONOSCOPY

## 2022-10-24 ENCOUNTER — Encounter: Payer: Self-pay | Admitting: Pharmacist

## 2022-10-28 NOTE — Progress Notes (Signed)
Madelaine Bhat, CMA,acting as a Neurosurgeon for Arnette Felts, FNP.,have documented all relevant documentation on the behalf of Arnette Felts, FNP,as directed by  Arnette Felts, FNP while in the presence of Arnette Felts, FNP.  Subjective:  Patient ID: Jacqueline Baxter , female    DOB: 01-05-1966 , 57 y.o.   MRN: [id number]  Chief Complaint  Patient presents with   Hypertension    HPI  Patient presents today for a bp and chol follow up, Patient reports compliance with medications. Patient denies any Chest pain, SOB, and headaches. Patient has no concerns today. She is not as stressed but has a small amount of anxiety. She is interested in quitting smoking.   BP Readings from Last 3 Encounters: 10/30/22 : 110/70 05/14/22 : (!) 141/90 04/29/22 : 118/62   Wt Readings from Last 3 Encounters: 10/30/22 : 154 lb 6.4 oz (70 kg) 05/14/22 : 157 lb (71.2 kg) 04/29/22 : 153 lb (69.4 kg)       Past Medical History:  Diagnosis Date   Abdominal bloating 04/24/2022   Abnormal weight gain 04/24/2022   Acid reflux    Arthritis    Bell's palsy 07/2020   Headache(784.0)    Hyperlipidemia    Symptomatic cholelithiasis 07/20/2012   Vitamin D deficiency disease      Family History  Problem Relation Age of Onset   Diabetes Mother    Bell's palsy Neg Hx      Current Outpatient Medications:    ALPRAZolam (XANAX) 0.5 MG tablet, for sedation before MRI scan; take 1 tab 1 hour before scan; may repeat 1 tab 15 min before scan, Disp: 3 tablet, Rfl: 0   buPROPion (WELLBUTRIN XL) 150 MG 24 hr tablet, Take 1 tablet (150 mg total) by mouth every morning., Disp: 30 tablet, Rfl: 2   cyclobenzaprine (FLEXERIL) 10 MG tablet, Take 1 tablet (10 mg total) by mouth 3 (three) times daily as needed for muscle spasms., Disp: 30 tablet, Rfl: 0   hydroxypropyl methylcellulose / hypromellose (ISOPTO TEARS / GONIOVISC) 2.5 % ophthalmic solution, Place 1 drop into the left eye 4 (four) times daily as needed for dry eyes.,  Disp: 15 mL, Rfl: 12   Multiple Vitamin (MULTIVITAMIN WITH MINERALS) TABS, Take 1 tablet by mouth daily., Disp: , Rfl:    omeprazole (PRILOSEC) 40 MG capsule, Take 40 mg by mouth daily., Disp: , Rfl:    Probiotic Product (PROBIOTIC DAILY PO), Take 1 capsule by mouth daily at 12 noon., Disp: , Rfl:    Vitamin D, Ergocalciferol, (DRISDOL) 1.25 MG (50000 UNIT) CAPS capsule, Take 1 capsule (50,000 Units total) by mouth every 7 (seven) days., Disp: 12 capsule, Rfl: 1   amLODipine (NORVASC) 2.5 MG tablet, Take 1 tablet (2.5 mg total) by mouth daily., Disp: 90 tablet, Rfl: 1   Allergies  Allergen Reactions   Penicillins Other (See Comments)    Unknown childhood reaction,swelling     Review of Systems  Constitutional: Negative.   HENT: Negative.    Eyes: Negative.   Respiratory: Negative.    Cardiovascular: Negative.   Gastrointestinal: Negative.   Psychiatric/Behavioral: Negative.       Today's Vitals   10/30/22 1409  BP: 110/70  Pulse: 92  Temp: 98.6 F (37 C)  TempSrc: Oral  Weight: 154 lb 6.4 oz (70 kg)  Height: 5\' 1"  (1.549 m)  PainSc: 0-No pain   Body mass index is 29.17 kg/m.  Wt Readings from Last 3 Encounters:  10/30/22 154 lb 6.4  oz (70 kg)  05/14/22 157 lb (71.2 kg)  04/29/22 153 lb (69.4 kg)     Objective:  Physical Exam Vitals reviewed.  Constitutional:      General: She is not in acute distress.    Appearance: Normal appearance. She is well-developed and normal weight.  HENT:     Head: Normocephalic and atraumatic.  Eyes:     Pupils: Pupils are equal, round, and reactive to light.  Cardiovascular:     Rate and Rhythm: Normal rate and regular rhythm.     Pulses: Normal pulses.     Heart sounds: Normal heart sounds. No murmur heard. Pulmonary:     Effort: Pulmonary effort is normal. No respiratory distress.     Breath sounds: Normal breath sounds. No wheezing.  Musculoskeletal:        General: No swelling. Normal range of motion.     Right lower leg:  No edema.     Left lower leg: No edema.  Skin:    General: Skin is warm and dry.     Capillary Refill: Capillary refill takes less than 2 seconds.  Neurological:     General: No focal deficit present.     Mental Status: She is alert and oriented to person, place, and time.     Cranial Nerves: Facial asymmetry (due to Bell's Palsy) present. No cranial nerve deficit.     Motor: No weakness.  Psychiatric:        Mood and Affect: Mood normal.         Assessment And Plan:  Essential hypertension Assessment & Plan: Blood pressure is well-controlled.  Continue current medications.  Orders: -     Basic metabolic panel -     amLODIPine Besylate; Take 1 tablet (2.5 mg total) by mouth daily.  Dispense: 90 tablet; Refill: 1  Mixed hyperlipidemia Assessment & Plan: Cholesterol levels were improved at last visit however remains elevated above goal.  Continue statin, tolerating well.  Continue focusing on low-fat diet.  Orders: -     Lipid panel  Abnormal glucose Assessment & Plan: Hemoglobin A1c was improved at last visit.  Will recheck hemoglobin A1c today.  Orders: -     Hemoglobin A1c  Tobacco use Assessment & Plan: She is currently working on quitting smoking, she is taking Wellbutrin without any difficulty.  Orders: -     buPROPion HCl ER (XL); Take 1 tablet (150 mg total) by mouth every morning.  Dispense: 30 tablet; Refill: 2  Need for shingles vaccine Assessment & Plan: Shingrix given in office     Orders: -     Varicella-zoster vaccine IM  Influenza vaccination declined Assessment & Plan: Patient declined influenza vaccination at this time. Patient is aware that influenza vaccine prevents illness in 70% of healthy people, and reduces hospitalizations to 30-70% in elderly. This vaccine is recommended annually. Education has been provided regarding the importance of this vaccine but patient still declined. Advised may receive this vaccine at local pharmacy or Health  Dept.or vaccine clinic. Aware to provide a copy of the vaccination record if obtained from local pharmacy or Health Dept.  Pt is willing to accept risk associated with refusing vaccination.    Overweight (BMI 25.0-29.9)    Return for 3 month f/u smoking cessation.  Patient was given opportunity to ask questions. Patient verbalized understanding of the plan and was able to repeat key elements of the plan. All questions were answered to their satisfaction.   I, Arnette Felts, FNP,  have reviewed all documentation for this visit. The documentation on 10/30/22 for the exam, diagnosis, procedures, and orders are all accurate and complete.   IF YOU HAVE BEEN REFERRED TO A SPECIALIST, IT MAY TAKE 1-2 WEEKS TO SCHEDULE/PROCESS THE REFERRAL. IF YOU HAVE NOT HEARD FROM US/SPECIALIST IN TWO WEEKS, PLEASE GIVE [place] A CALL AT [phone].

## 2022-10-30 ENCOUNTER — Encounter: Payer: Self-pay | Admitting: Nurse Practitioner

## 2022-10-30 ENCOUNTER — Ambulatory Visit: Payer: 59 | Admitting: Nurse Practitioner

## 2022-10-30 VITALS — BP 110/70 | HR 92 | Temp 98.6°F | Ht 61.0 in | Wt 154.4 lb

## 2022-10-30 DIAGNOSIS — R7309 Other abnormal glucose: Secondary | ICD-10-CM

## 2022-10-30 DIAGNOSIS — Z23 Encounter for immunization: Secondary | ICD-10-CM

## 2022-10-30 DIAGNOSIS — E782 Mixed hyperlipidemia: Secondary | ICD-10-CM | POA: Diagnosis not present

## 2022-10-30 DIAGNOSIS — I10 Essential (primary) hypertension: Secondary | ICD-10-CM

## 2022-10-30 DIAGNOSIS — E663 Overweight: Secondary | ICD-10-CM

## 2022-10-30 DIAGNOSIS — Z2821 Immunization not carried out because of patient refusal: Secondary | ICD-10-CM

## 2022-10-30 DIAGNOSIS — Z720 Tobacco use: Secondary | ICD-10-CM

## 2022-10-30 MED ORDER — BUPROPION HCL ER (XL) 150 MG PO TB24
150.0000 mg | ORAL_TABLET | ORAL | 2 refills | Status: DC
Start: 2022-10-30 — End: 2023-07-20

## 2022-10-30 MED ORDER — AMLODIPINE BESYLATE 2.5 MG PO TABS
2.5000 mg | ORAL_TABLET | Freq: Every day | ORAL | 1 refills | Status: DC
Start: 2022-10-30 — End: 2023-07-30

## 2022-10-30 NOTE — Patient Instructions (Signed)
[place] smoking cessation  [phone]

## 2022-10-31 LAB — LIPID PANEL
Chol/HDL Ratio: 3.2 ratio (ref 0.0–4.4)
Cholesterol, Total: 227 mg/dL — ABNORMAL HIGH (ref 100–199)
HDL: 71 mg/dL (ref >39)
LDL Chol Calc (NIH): 136 mg/dL — ABNORMAL HIGH (ref 0–99)
Triglycerides: 113 mg/dL (ref 0–149)
VLDL Cholesterol Cal: 20 mg/dL (ref 5–40)

## 2022-10-31 LAB — BASIC METABOLIC PANEL
BUN/Creatinine Ratio: 16 (ref 9–23)
BUN: 13 mg/dL (ref 6–24)
CO2: 25 mmol/L (ref 20–29)
Calcium: 9.6 mg/dL (ref 8.7–10.2)
Chloride: 100 mmol/L (ref 96–106)
Creatinine, Ser: 0.8 mg/dL (ref 0.57–1.00)
Glucose: 81 mg/dL (ref 70–99)
Potassium: 4.5 mmol/L (ref 3.5–5.2)
Sodium: 141 mmol/L (ref 134–144)
eGFR: 86 mL/min/{1.73_m2} (ref >59)

## 2022-10-31 LAB — HEMOGLOBIN A1C
Est. average glucose Bld gHb Est-mCnc: 126 mg/dL
Hgb A1c MFr Bld: 6 % — ABNORMAL HIGH (ref 4.8–5.6)

## 2022-11-11 ENCOUNTER — Encounter: Payer: Self-pay | Admitting: Nurse Practitioner

## 2022-11-11 NOTE — Assessment & Plan Note (Signed)

## 2022-11-11 NOTE — Assessment & Plan Note (Signed)
Cholesterol levels were improved at last visit however remains elevated above goal.  Continue statin, tolerating well.  Continue focusing on low-fat diet.

## 2022-11-11 NOTE — Assessment & Plan Note (Signed)
She is currently working on quitting smoking, she is taking Wellbutrin without any difficulty.

## 2022-11-11 NOTE — Assessment & Plan Note (Signed)
Blood pressure is well controlled. Continue current medications. 

## 2022-11-11 NOTE — Assessment & Plan Note (Signed)
Shingrix given in office

## 2022-11-11 NOTE — Assessment & Plan Note (Signed)
Hemoglobin A1c was improved at last visit.  Will recheck hemoglobin A1c today.

## 2022-12-02 ENCOUNTER — Other Ambulatory Visit: Payer: Self-pay | Admitting: Nurse Practitioner

## 2022-12-02 DIAGNOSIS — G510 Bell's palsy: Secondary | ICD-10-CM

## 2022-12-11 ENCOUNTER — Other Ambulatory Visit: Payer: Self-pay | Admitting: Nurse Practitioner

## 2022-12-11 DIAGNOSIS — E782 Mixed hyperlipidemia: Secondary | ICD-10-CM

## 2022-12-11 MED ORDER — METFORMIN HCL 500 MG PO TABS: 500.0000 mg | ORAL_TABLET | Freq: Every day | ORAL | 1 refills | Status: DC

## 2022-12-11 NOTE — Progress Notes (Signed)
She needs to have a 6 week f/u medication check.

## 2022-12-12 ENCOUNTER — Ambulatory Visit (INDEPENDENT_AMBULATORY_CARE_PROVIDER_SITE_OTHER): Payer: 59

## 2022-12-12 VITALS — BP 122/80 | HR 91 | Temp 98.5°F | Ht 61.0 in | Wt 154.0 lb

## 2022-12-12 DIAGNOSIS — Z23 Encounter for immunization: Secondary | ICD-10-CM | POA: Diagnosis not present

## 2022-12-12 NOTE — Progress Notes (Signed)
Patient presents today for a Flu shot.

## 2023-01-07 NOTE — Progress Notes (Deleted)
Madelaine Bhat, CMA,acting as a Neurosurgeon for Arnette Felts, FNP.,have documented all relevant documentation on the behalf of Arnette Felts, FNP,as directed by  Arnette Felts, FNP while in the presence of Arnette Felts, FNP.  Subjective:  Patient ID: Jacqueline Baxter , female    DOB: May 23, 1965 , 57 y.o.   MRN: [id number]  No chief complaint on file.   HPI  Patient presents today for a bp and Chol follow up, Patient reports compliance with medication. Patient denies any chest pain, SOB, or headaches. Patient has no concerns today.     Past Medical History:  Diagnosis Date  . Abdominal bloating 04/24/2022  . Abnormal weight gain 04/24/2022  . Acid reflux   . Arthritis   . Bell's palsy 07/2020  . Headache(784.0)   . Hyperlipidemia   . Symptomatic cholelithiasis 07/20/2012  . Vitamin D deficiency disease      Family History  Problem Relation Age of Onset  . Diabetes Mother   . Bell's palsy Neg Hx      Current Outpatient Medications:  .  ALPRAZolam (XANAX) 0.5 MG tablet, for sedation before MRI scan; take 1 tab 1 hour before scan; may repeat 1 tab 15 min before scan, Disp: 3 tablet, Rfl: 0 .  amLODipine (NORVASC) 2.5 MG tablet, Take 1 tablet (2.5 mg total) by mouth daily., Disp: 90 tablet, Rfl: 1 .  buPROPion (WELLBUTRIN XL) 150 MG 24 hr tablet, Take 1 tablet (150 mg total) by mouth every morning., Disp: 30 tablet, Rfl: 2 .  cyclobenzaprine (FLEXERIL) 10 MG tablet, Take 1 tablet by mouth three times daily as needed for muscle spasm, Disp: 30 tablet, Rfl: 0 .  hydroxypropyl methylcellulose / hypromellose (ISOPTO TEARS / GONIOVISC) 2.5 % ophthalmic solution, Place 1 drop into the left eye 4 (four) times daily as needed for dry eyes., Disp: 15 mL, Rfl: 12 .  metFORMIN (GLUCOPHAGE) 500 MG tablet, Take 1 tablet (500 mg total) by mouth daily with breakfast., Disp: 90 tablet, Rfl: 1 .  Multiple Vitamin (MULTIVITAMIN WITH MINERALS) TABS, Take 1 tablet by mouth daily., Disp: , Rfl:  .  omeprazole  (PRILOSEC) 40 MG capsule, Take 40 mg by mouth daily., Disp: , Rfl:  .  Probiotic Product (PROBIOTIC DAILY PO), Take 1 capsule by mouth daily at 12 noon., Disp: , Rfl:  .  Vitamin D, Ergocalciferol, (DRISDOL) 1.25 MG (50000 UNIT) CAPS capsule, Take 1 capsule (50,000 Units total) by mouth every 7 (seven) days., Disp: 12 capsule, Rfl: 1   Allergies  Allergen Reactions  . Penicillins Other (See Comments)    Unknown childhood reaction,swelling     Review of Systems  Constitutional: Negative.   HENT: Negative.    Eyes: Negative.   Respiratory: Negative.    Cardiovascular: Negative.   Gastrointestinal: Negative.     There were no vitals filed for this visit. There is no height or weight on file to calculate BMI.  Wt Readings from Last 3 Encounters:  12/12/22 154 lb (69.9 kg)  10/30/22 154 lb 6.4 oz (70 kg)  05/14/22 157 lb (71.2 kg)    The 10-year ASCVD risk score (Arnett DK, et al., 2019) is: 8.6%   Values used to calculate the score:     Age: 28 years     Sex: Female     Is Non-Hispanic African American: Yes     Diabetic: No     Tobacco smoker: Yes     Systolic Blood Pressure: 122 mmHg  Is BP treated: Yes     HDL Cholesterol: 71 mg/dL     Total Cholesterol: 227 mg/dL  Objective:  Physical Exam      Assessment And Plan:  Tobacco use  Mixed hyperlipidemia  Essential hypertension    No follow-ups on file.  Patient was given opportunity to ask questions. Patient verbalized understanding of the plan and was able to repeat key elements of the plan. All questions were answered to their satisfaction.    Jeanell Sparrow, FNP, have reviewed all documentation for this visit. The documentation on 01/07/23 for the exam, diagnosis, procedures, and orders are all accurate and complete.   IF YOU HAVE BEEN REFERRED TO A SPECIALIST, IT MAY TAKE 1-2 WEEKS TO SCHEDULE/PROCESS THE REFERRAL. IF YOU HAVE NOT HEARD FROM US/SPECIALIST IN TWO WEEKS, PLEASE GIVE [place] A CALL AT [phone] X  [phone].

## 2023-01-08 ENCOUNTER — Ambulatory Visit: Payer: 59 | Admitting: Nurse Practitioner

## 2023-01-09 ENCOUNTER — Other Ambulatory Visit (HOSPITAL_COMMUNITY): Payer: 59

## 2023-01-16 ENCOUNTER — Ambulatory Visit (HOSPITAL_COMMUNITY): Payer: 59

## 2023-01-16 ENCOUNTER — Encounter (HOSPITAL_COMMUNITY): Payer: Self-pay

## 2023-02-04 ENCOUNTER — Ambulatory Visit: Payer: 59 | Admitting: Nurse Practitioner

## 2023-03-05 ENCOUNTER — Ambulatory Visit (HOSPITAL_COMMUNITY)
Admission: RE | Admit: 2023-03-05 | Discharge: 2023-03-05 | Disposition: A | Discharge status: Home / Self Care | Payer: Self-pay | Source: Ambulatory Visit | Attending: Nurse Practitioner | Admitting: Nurse Practitioner

## 2023-03-05 DIAGNOSIS — E782 Mixed hyperlipidemia: Secondary | ICD-10-CM | POA: Insufficient documentation

## 2023-04-22 ENCOUNTER — Encounter: Payer: Self-pay | Admitting: Nurse Practitioner

## 2023-04-30 ENCOUNTER — Encounter: Payer: Self-pay | Admitting: Nurse Practitioner

## 2023-05-19 NOTE — Progress Notes (Signed)
 [place], CMA,acting as a Neurosurgeon for Susanna Epley, FNP.,have documented all relevant documentation on the behalf of Susanna Epley, FNP,as directed by  Susanna Epley, FNP while in the presence of Susanna Epley, FNP.  Subjective:    Patient ID: Jacqueline Baxter , female    DOB: Jun 25, 1965 , 58 y.o.   MRN: [id number]  Chief Complaint  Patient presents with   Annual Exam    HPI  Patient presents today for HM, Patient reports compliance with medication. Patient denies any chest pain, SOB, or headaches. Patient reports a knot on her right knee that appeared a week ago. Continues to see [facility] for her gyn care. She has not been taking Wellbutrin for her smoking did not give it long enough time.   Hypertension This is a new problem. The current episode started in the past 7 days. The problem is uncontrolled. Pertinent negatives include no anxiety, blurred vision, chest pain, headaches, palpitations, shortness of breath or sweats. Associated agents: she has been taking goody powders for her headache - she has been suffering with a toothache, Risk factors for coronary artery disease include stress and smoking/tobacco exposure.    Past Medical History:  Diagnosis Date   Abdominal bloating 04/24/2022   Abnormal weight gain 04/24/2022   Acid reflux    Arthritis    Bell's palsy 07/2020   Headache(784.0)    Hyperlipidemia    Symptomatic cholelithiasis 07/20/2012   Vitamin D deficiency disease      Family History  Problem Relation Age of Onset   Diabetes Mother    Bell's palsy Neg Hx      Current Outpatient Medications:    ALPRAZolam (XANAX) 0.5 MG tablet, for sedation before MRI scan; take 1 tab 1 hour before scan; may repeat 1 tab 15 min before scan, Disp: 3 tablet, Rfl: 0   amLODipine (NORVASC) 2.5 MG tablet, Take 1 tablet (2.5 mg total) by mouth daily., Disp: 90 tablet, Rfl: 1   buPROPion (WELLBUTRIN XL) 150 MG 24 hr tablet, Take 1 tablet (150 mg total) by mouth every  morning., Disp: 30 tablet, Rfl: 2   cyclobenzaprine (FLEXERIL) 10 MG tablet, Take 1 tablet by mouth three times daily as needed for muscle spasm, Disp: 30 tablet, Rfl: 0   hydroxypropyl methylcellulose / hypromellose (ISOPTO TEARS / GONIOVISC) 2.5 % ophthalmic solution, Place 1 drop into the left eye 4 (four) times daily as needed for dry eyes., Disp: 15 mL, Rfl: 12   metFORMIN (GLUCOPHAGE) 500 MG tablet, Take 1 tablet (500 mg total) by mouth daily with breakfast., Disp: 90 tablet, Rfl: 1   Multiple Vitamin (MULTIVITAMIN WITH MINERALS) TABS, Take 1 tablet by mouth daily., Disp: , Rfl:    omeprazole (PRILOSEC) 40 MG capsule, Take 40 mg by mouth daily., Disp: , Rfl:    Probiotic Product (PROBIOTIC DAILY PO), Take 1 capsule by mouth daily at 12 noon., Disp: , Rfl:    Vitamin D, Ergocalciferol, (DRISDOL) 1.25 MG (50000 UNIT) CAPS capsule, Take 1 capsule (50,000 Units total) by mouth every 7 (seven) days., Disp: 12 capsule, Rfl: 1   Allergies  Allergen Reactions   Penicillins Other (See Comments)    Unknown childhood reaction,swelling       No LMP recorded (lmp unknown). Patient is postmenopausal.. Negative for Dysmenorrhea and Negative for Menorrhagia. Negative for: breast discharge, breast lump(s), breast pain and breast self exam. Associated symptoms include abnormal vaginal bleeding. Pertinent negatives include abnormal bleeding (hematology), anxiety, decreased libido, depression, difficulty falling  sleep, dyspareunia, history of infertility, nocturia, sexual dysfunction, sleep disturbances, urinary incontinence, urinary urgency, vaginal discharge and vaginal itching. Diet regular; she admits it could be better and focusing more on better choices. She is drinking about 4-5 bottles a day. The patient states her exercise level is moderate 4 times a week.   The patient's tobacco use is:  Social History   Tobacco Use  Smoking Status Every Day   Current packs/day: 0.25   Average packs/day: 0.3  packs/day for 20.0 years (5.0 ttl pk-yrs)   Types: Cigarettes  Smokeless Tobacco Never  Tobacco Comments   she is smoking 6 - 7 cigarettes a day, 11/29 - smoking 5 cigs a day   She has been exposed to passive smoke. The patient's alcohol use is:  Social History   Substance and Sexual Activity  Alcohol Use Yes   Comment: occ   Additional information: Last pap 03/20/2021, next one scheduled for 03/20/2024.    Review of Systems  Constitutional: Negative.   HENT: Negative.    Eyes: Negative.  Negative for blurred vision.  Respiratory: Negative.  Negative for cough and shortness of breath.   Cardiovascular: Negative.  Negative for chest pain, palpitations and leg swelling.  Gastrointestinal: Negative.   Endocrine: Negative.   Genitourinary: Negative.   Musculoskeletal: Negative.   Skin: Negative.   Neurological:  Negative for dizziness and headaches.  Hematological: Negative.   Psychiatric/Behavioral: Negative.       Today's Vitals   05/20/23 1134  BP: 128/80  Pulse: 85  Temp: 98.5 F (36.9 C)  TempSrc: Oral  Weight: 155 lb 3.2 oz (70.4 kg)  Height: 5\' 1"  (1.549 m)  PainSc: 0-No pain   Body mass index is 29.32 kg/m.  Wt Readings from Last 3 Encounters:  05/20/23 155 lb 3.2 oz (70.4 kg)  12/12/22 154 lb (69.9 kg)  10/30/22 154 lb 6.4 oz (70 kg)     Objective:  Physical Exam Vitals and nursing note reviewed.  Constitutional:      General: She is not in acute distress.    Appearance: Normal appearance. She is well-developed.  HENT:     Head: Normocephalic and atraumatic.     Right Ear: Hearing, tympanic membrane, ear canal and external ear normal. There is no impacted cerumen.     Left Ear: Hearing, tympanic membrane, ear canal and external ear normal. There is no impacted cerumen.     Nose: Nose normal.     Mouth/Throat:     Mouth: Mucous membranes are moist.  Eyes:     General: Lids are normal.     Extraocular Movements: Extraocular movements intact.      Conjunctiva/sclera: Conjunctivae normal.     Pupils: Pupils are equal, round, and reactive to light.     Funduscopic exam:    Right eye: No papilledema.        Left eye: No papilledema.  Neck:     Thyroid: No thyroid mass.     Vascular: No carotid bruit.  Cardiovascular:     Rate and Rhythm: Normal rate and regular rhythm.     Pulses: Normal pulses.     Heart sounds: Normal heart sounds. No murmur heard. Pulmonary:     Effort: Pulmonary effort is normal. No respiratory distress.     Breath sounds: Normal breath sounds. No wheezing.  Chest:     Chest wall: No mass.  Breasts:    Tanner Score is 5.     Right: Normal. No mass or  tenderness.     Left: Normal. No mass or tenderness.  Abdominal:     General: Abdomen is flat. Bowel sounds are normal. There is no distension.     Palpations: Abdomen is soft.     Tenderness: There is no abdominal tenderness.  Genitourinary:    Rectum: Guaiac result negative.  Musculoskeletal:        General: No swelling. Normal range of motion.     Cervical back: Full passive range of motion without pain, normal range of motion and neck supple.     Right lower leg: No edema.     Left lower leg: No edema.  Lymphadenopathy:     Upper Body:     Right upper body: No supraclavicular, axillary or pectoral adenopathy.     Left upper body: No supraclavicular, axillary or pectoral adenopathy.  Skin:    General: Skin is warm and dry.     Capillary Refill: Capillary refill takes less than 2 seconds.  Neurological:     General: No focal deficit present.     Mental Status: She is alert and oriented to person, place, and time.     Cranial Nerves: No cranial nerve deficit.     Sensory: No sensory deficit.  Psychiatric:        Mood and Affect: Mood normal.        Behavior: Behavior normal.        Thought Content: Thought content normal.        Judgment: Judgment normal.        05/20/2023   11:37 AM 04/29/2022    2:44 PM 01/15/2022   10:17 AM 01/26/2020     8:45 AM 01/20/2019    8:44 AM  Depression screen PHQ 2/9  Decreased Interest 0 0 0 0 0  Down, Depressed, Hopeless 0 0 0 0 0  PHQ - 2 Score 0 0 0 0 0  Altered sleeping 0      Tired, decreased energy 0      Change in appetite 0      Feeling bad or failure about yourself  0      Trouble concentrating 0      Moving slowly or fidgety/restless 0      Suicidal thoughts 0      PHQ-9 Score 0      Difficult doing work/chores Not difficult at all           Assessment And Plan:     Encounter for annual health examination Assessment & Plan: Behavior modifications discussed and diet history reviewed.   Pt will continue to exercise regularly and modify diet with low GI, plant based foods and decrease intake of processed foods.  Recommend intake of daily multivitamin, Vitamin D, and calcium.  Recommend mammogram and colonoscopy for preventive screenings, as well as recommend immunizations that include influenza, TDAP, and Shingles    Need for pneumococcal vaccination Assessment & Plan: Prevnar 20 given in office  Orders: -     Pneumococcal conjugate vaccine 20-valent  Mixed hyperlipidemia Assessment & Plan: Cholesterol levels were improved at last visit however remains elevated above goal.  Continue statin, tolerating well.  Continue focusing on low-fat diet.  Orders: -     Lipid panel  Need for Tdap vaccination Assessment & Plan: Will give tetanus vaccine today while in office. Refer to order management. TDAP will be administered to adults 40-70 years old every 10 years.   Orders: -     Tdap vaccine greater than  or equal to 7yo IM  Encounter for screening mammogram for breast cancer Assessment & Plan: Pt instructed on Self Breast Exam.According to ACOG guidelines Women aged 1 and older are recommended to get an annual mammogram.  Pt encouraged to get annual mammogram    Overweight (BMI 25.0-29.9)  Essential hypertension Assessment & Plan: Blood pressure is  well-controlled.  Continue current medications.  Orders: -     EKG 12-Lead -     POCT URINALYSIS DIP (CLINITEK) -     Microalbumin / creatinine urine ratio -     CMP14+EGFR  Abnormal glucose Assessment & Plan: Hemoglobin A1c was improved at last visit.  Will recheck hemoglobin A1c today.  Orders: -     Hemoglobin A1c  Vitamin D deficiency Assessment & Plan: Will check vitamin D level and supplement as needed.    Also encouraged to spend 15 minutes in the sun daily.    Orders: -     VITAMIN D 25 Hydroxy (Vit-D Deficiency, Fractures)  Hot flashes Assessment & Plan: Encouraged to limit intake of sugary foods and cut back on carbs.  Orders: -     TSH  Mass of right knee -     US  SOFT TISSUE LOWER EXTREMITY LIMITED RIGHT (NON-VASCULAR); Future  Comedone -     Ambulatory referral to Dermatology  Other long term (current) drug therapy -     CBC with Differential/Platelet   Return for 1 year physical, 6 month bp check. Patient was given opportunity to ask questions. Patient verbalized understanding of the plan and was able to repeat key elements of the plan. All questions were answered to their satisfaction.   Susanna Epley, FNP  I, Susanna Epley, FNP, have reviewed all documentation for this visit. The documentation on 05/20/23 for the exam, diagnosis, procedures, and orders are all accurate and complete.

## 2023-05-20 ENCOUNTER — Other Ambulatory Visit: Payer: Self-pay | Admitting: Nurse Practitioner

## 2023-05-20 ENCOUNTER — Ambulatory Visit (INDEPENDENT_AMBULATORY_CARE_PROVIDER_SITE_OTHER): Admitting: Nurse Practitioner

## 2023-05-20 ENCOUNTER — Encounter: Payer: Self-pay | Admitting: Nurse Practitioner

## 2023-05-20 VITALS — BP 128/80 | HR 85 | Temp 98.5°F | Ht 61.0 in | Wt 155.2 lb

## 2023-05-20 DIAGNOSIS — R232 Flushing: Secondary | ICD-10-CM

## 2023-05-20 DIAGNOSIS — Z0000 Encounter for general adult medical examination without abnormal findings: Secondary | ICD-10-CM | POA: Diagnosis not present

## 2023-05-20 DIAGNOSIS — Z1231 Encounter for screening mammogram for malignant neoplasm of breast: Secondary | ICD-10-CM

## 2023-05-20 DIAGNOSIS — E663 Overweight: Secondary | ICD-10-CM

## 2023-05-20 DIAGNOSIS — E782 Mixed hyperlipidemia: Secondary | ICD-10-CM

## 2023-05-20 DIAGNOSIS — Z23 Encounter for immunization: Secondary | ICD-10-CM

## 2023-05-20 DIAGNOSIS — L700 Acne vulgaris: Secondary | ICD-10-CM

## 2023-05-20 DIAGNOSIS — R2241 Localized swelling, mass and lump, right lower limb: Secondary | ICD-10-CM

## 2023-05-20 DIAGNOSIS — R7309 Other abnormal glucose: Secondary | ICD-10-CM

## 2023-05-20 DIAGNOSIS — I10 Essential (primary) hypertension: Secondary | ICD-10-CM | POA: Diagnosis not present

## 2023-05-20 DIAGNOSIS — E559 Vitamin D deficiency, unspecified: Secondary | ICD-10-CM

## 2023-05-20 DIAGNOSIS — Z79899 Other long term (current) drug therapy: Secondary | ICD-10-CM

## 2023-05-20 DIAGNOSIS — R921 Mammographic calcification found on diagnostic imaging of breast: Secondary | ICD-10-CM

## 2023-05-20 LAB — POCT URINALYSIS DIP (CLINITEK)
Blood, UA: NEGATIVE
Glucose, UA: NEGATIVE mg/dL
Leukocytes, UA: NEGATIVE
Nitrite, UA: NEGATIVE
POC PROTEIN,UA: 30 — AB
Spec Grav, UA: >=1.03 — AB (ref 1.010–1.025)
Urobilinogen, UA: 0.2 U/dL
pH, UA: 5.5 (ref 5.0–8.0)

## 2023-05-21 ENCOUNTER — Encounter: Payer: Self-pay | Admitting: Nurse Practitioner

## 2023-05-21 ENCOUNTER — Ambulatory Visit
Admission: RE | Admit: 2023-05-21 | Discharge: 2023-05-21 | Disposition: A | Discharge status: Home / Self Care | Source: Ambulatory Visit | Attending: Nurse Practitioner | Admitting: Nurse Practitioner

## 2023-05-21 DIAGNOSIS — R2241 Localized swelling, mass and lump, right lower limb: Secondary | ICD-10-CM

## 2023-05-21 LAB — HEMOGLOBIN A1C
Est. average glucose Bld gHb Est-mCnc: 128 mg/dL
Hgb A1c MFr Bld: 6.1 % — ABNORMAL HIGH (ref 4.8–5.6)

## 2023-05-21 LAB — CMP14+EGFR
ALT: 14 IU/L (ref 0–32)
AST: 20 IU/L (ref 0–40)
Albumin: 4.6 g/dL (ref 3.8–4.9)
Alkaline Phosphatase: 60 IU/L (ref 44–121)
BUN/Creatinine Ratio: 15 (ref 9–23)
BUN: 13 mg/dL (ref 6–24)
Bilirubin Total: 0.3 mg/dL (ref 0.0–1.2)
CO2: 24 mmol/L (ref 20–29)
Calcium: 9.7 mg/dL (ref 8.7–10.2)
Chloride: 101 mmol/L (ref 96–106)
Creatinine, Ser: 0.84 mg/dL (ref 0.57–1.00)
Globulin, Total: 2.5 g/dL (ref 1.5–4.5)
Glucose: 75 mg/dL (ref 70–99)
Potassium: 4.7 mmol/L (ref 3.5–5.2)
Sodium: 142 mmol/L (ref 134–144)
Total Protein: 7.1 g/dL (ref 6.0–8.5)
eGFR: 81 mL/min/{1.73_m2} (ref >59)

## 2023-05-21 LAB — CBC WITH DIFFERENTIAL/PLATELET
Basophils Absolute: 0 10*3/uL (ref 0.0–0.2)
Basos: 0 %
EOS (ABSOLUTE): 0.1 10*3/uL (ref 0.0–0.4)
Eos: 1 %
Hematocrit: 40 % (ref 34.0–46.6)
Hemoglobin: 13.5 g/dL (ref 11.1–15.9)
Immature Grans (Abs): 0 10*3/uL (ref 0.0–0.1)
Immature Granulocytes: 0 %
Lymphocytes Absolute: 3.7 10*3/uL — ABNORMAL HIGH (ref 0.7–3.1)
Lymphs: 46 %
MCH: 27.8 pg (ref 26.6–33.0)
MCHC: 33.8 g/dL (ref 31.5–35.7)
MCV: 82 fL (ref 79–97)
Monocytes Absolute: 0.5 10*3/uL (ref 0.1–0.9)
Monocytes: 6 %
Neutrophils Absolute: 3.8 10*3/uL (ref 1.4–7.0)
Neutrophils: 47 %
Platelets: 308 10*3/uL (ref 150–450)
RBC: 4.86 x10E6/uL (ref 3.77–5.28)
RDW: 13.3 % (ref 11.7–15.4)
WBC: 8 10*3/uL (ref 3.4–10.8)

## 2023-05-21 LAB — LIPID PANEL
Chol/HDL Ratio: 2.9 ratio (ref 0.0–4.4)
Cholesterol, Total: 221 mg/dL — ABNORMAL HIGH (ref 100–199)
HDL: 76 mg/dL (ref >39)
LDL Chol Calc (NIH): 128 mg/dL — ABNORMAL HIGH (ref 0–99)
Triglycerides: 95 mg/dL (ref 0–149)
VLDL Cholesterol Cal: 17 mg/dL (ref 5–40)

## 2023-05-21 LAB — TSH: TSH: 0.558 u[IU]/mL (ref 0.450–4.500)

## 2023-05-21 LAB — VITAMIN D 25 HYDROXY (VIT D DEFICIENCY, FRACTURES): Vit D, 25-Hydroxy: 30.1 ng/mL (ref 30.0–100.0)

## 2023-05-21 LAB — MICROALBUMIN / CREATININE URINE RATIO
Creatinine, Urine: 321.1 mg/dL
Microalb/Creat Ratio: 5 mg/g{creat} (ref 0–29)
Microalbumin, Urine: 15.3 ug/mL

## 2023-06-01 ENCOUNTER — Encounter: Payer: Self-pay | Admitting: Nurse Practitioner

## 2023-06-01 DIAGNOSIS — L700 Acne vulgaris: Secondary | ICD-10-CM | POA: Insufficient documentation

## 2023-06-01 DIAGNOSIS — Z0000 Encounter for general adult medical examination without abnormal findings: Secondary | ICD-10-CM | POA: Insufficient documentation

## 2023-06-01 DIAGNOSIS — Z1231 Encounter for screening mammogram for malignant neoplasm of breast: Secondary | ICD-10-CM | POA: Insufficient documentation

## 2023-06-01 DIAGNOSIS — R232 Flushing: Secondary | ICD-10-CM | POA: Insufficient documentation

## 2023-06-01 DIAGNOSIS — Z23 Encounter for immunization: Secondary | ICD-10-CM | POA: Insufficient documentation

## 2023-06-01 DIAGNOSIS — R2241 Localized swelling, mass and lump, right lower limb: Secondary | ICD-10-CM | POA: Insufficient documentation

## 2023-06-01 NOTE — Assessment & Plan Note (Signed)
 Blood pressure is well controlled. Continue current medications.

## 2023-06-01 NOTE — Assessment & Plan Note (Signed)

## 2023-06-01 NOTE — Assessment & Plan Note (Signed)
 Encouraged to limit intake of sugary foods and cut back on carbs.

## 2023-06-01 NOTE — Assessment & Plan Note (Signed)
 Will give tetanus vaccine today while in office. Refer to order management. TDAP will be administered to adults 44-58 years old every 10 years.

## 2023-06-01 NOTE — Assessment & Plan Note (Signed)
Hemoglobin A1c was improved at last visit.  Will recheck hemoglobin A1c today.

## 2023-06-01 NOTE — Assessment & Plan Note (Signed)
 Prevnar 20 given in office.

## 2023-06-01 NOTE — Assessment & Plan Note (Signed)
Cholesterol levels were improved at last visit however remains elevated above goal.  Continue statin, tolerating well.  Continue focusing on low-fat diet.

## 2023-06-01 NOTE — Assessment & Plan Note (Signed)
 Pt instructed on Self Breast Exam.According to ACOG guidelines Women aged 58 and older are recommended to get an annual mammogram.  Pt encouraged to get annual mammogram

## 2023-06-01 NOTE — Assessment & Plan Note (Signed)
 Will check vitamin D level and supplement as needed.    Also encouraged to spend 15 minutes in the sun daily.

## 2023-06-02 ENCOUNTER — Other Ambulatory Visit: Payer: Self-pay | Admitting: Nurse Practitioner

## 2023-06-02 ENCOUNTER — Other Ambulatory Visit (INDEPENDENT_AMBULATORY_CARE_PROVIDER_SITE_OTHER): Payer: Self-pay

## 2023-06-02 DIAGNOSIS — R809 Proteinuria, unspecified: Secondary | ICD-10-CM

## 2023-06-02 DIAGNOSIS — M25861 Other specified joint disorders, right knee: Secondary | ICD-10-CM

## 2023-06-02 LAB — POCT URINALYSIS DIP (CLINITEK)
Bilirubin, UA: NEGATIVE
Blood, UA: NEGATIVE
Glucose, UA: NEGATIVE mg/dL
Ketones, POC UA: NEGATIVE mg/dL
Leukocytes, UA: NEGATIVE
Nitrite, UA: NEGATIVE
POC PROTEIN,UA: NEGATIVE
Spec Grav, UA: >=1.03 — AB (ref 1.010–1.025)
Urobilinogen, UA: 0.2 U/dL
pH, UA: 6 (ref 5.0–8.0)

## 2023-06-17 ENCOUNTER — Ambulatory Visit (INDEPENDENT_AMBULATORY_CARE_PROVIDER_SITE_OTHER): Admitting: Orthopaedic Surgery

## 2023-06-17 ENCOUNTER — Other Ambulatory Visit (INDEPENDENT_AMBULATORY_CARE_PROVIDER_SITE_OTHER)

## 2023-06-17 ENCOUNTER — Encounter: Payer: Self-pay | Admitting: Orthopaedic Surgery

## 2023-06-17 ENCOUNTER — Other Ambulatory Visit: Payer: Self-pay

## 2023-06-17 DIAGNOSIS — G8929 Other chronic pain: Secondary | ICD-10-CM

## 2023-06-17 DIAGNOSIS — M25561 Pain in right knee: Secondary | ICD-10-CM

## 2023-06-17 NOTE — Progress Notes (Signed)
 Office Visit Note   Patient: Jacqueline Baxter           Date of Birth: 06-20-1965           MRN: [id number] Visit Date: 06/17/2023              Requested by: Susanna Epley, FNP [address] [place],  [place] [address] PCP: Susanna Epley, FNP   Assessment & Plan: Visit Diagnoses:  1. Chronic pain of right knee     Plan: Right knee mass along medial joint line, probable parameniscal cyst typically in the setting of chronic meniscal tear.  Asymptomatic until 1 month ago.  Not reporting mechanical symptoms.  Discussed treatment options to include aspiration and steroid injection of the cyst, intraarticular steroid injection, MRI for further clarification.  Patient will think about her options.    Follow-Up Instructions: No follow-ups on file.   Orders:  Orders Placed This Encounter  Procedures   XR KNEE 3 VIEW RIGHT   US  Guided Needle Placement - No Linked Charges   No orders of the defined types were placed in this encounter.     Procedures: No procedures performed   Clinical Data: No additional findings.   Subjective: Chief Complaint  Patient presents with   Right Knee - Pain    Patient is presenting for a cyst of the right knee that has been going on for the past 1 month.  Patient denies any inciting event or injury.  Patient states that she does not have pain but she did notice a bump on the anterior medial aspect of her knee.  Patient states that she got an ultrasound done and was advised to follow-up with Ortho for possible aspiration.  Patient states that the area is not painful, no increased sensation over the area and has not changed in size over the past 1 month.  Patient otherwise has no other concerns or issues at this time    Review of Systems  Constitutional: Negative.   HENT: Negative.    Eyes: Negative.   Respiratory: Negative.    Cardiovascular: Negative.   Endocrine: Negative.   Musculoskeletal: Negative.   Neurological: Negative.    Hematological: Negative.   Psychiatric/Behavioral: Negative.    All other systems reviewed and are negative.    Objective: Vital Signs: LMP  (LMP Unknown)   Physical Exam Vitals and nursing note reviewed.  Constitutional:      Appearance: She is well-developed.  HENT:     Head: Atraumatic.     Nose: Nose normal.  Eyes:     Extraocular Movements: Extraocular movements intact.  Cardiovascular:     Pulses: Normal pulses.  Pulmonary:     Effort: Pulmonary effort is normal.  Abdominal:     Palpations: Abdomen is soft.  Musculoskeletal:     Cervical back: Neck supple.  Skin:    General: Skin is warm.     Capillary Refill: Capillary refill takes less than 2 seconds.  Neurological:     Mental Status: She is alert. Mental status is at baseline.  Psychiatric:        Behavior: Behavior normal.        Thought Content: Thought content normal.        Judgment: Judgment normal.     Ortho Exam Inspection reveals a firm cyst of the medial joint line.  No aggressive features.  No tenderness to palpation over the area.  Range of motion the knee is full with flexion extension.  No tenderness over the medial lateral joint lines.  No swelling in the posterior aspect in the popliteal fossa.  No observable Baker's cyst.  Specialty Comments:  No specialty comments available.  Imaging: XR KNEE 3 VIEW RIGHT Result Date: 06/17/2023 X-rays of the right knee show no acute or structural abnormalities    PMFS History: Patient Active Problem List   Diagnosis Date Noted   Encounter for annual health examination 06/01/2023   Need for pneumococcal vaccination 06/01/2023   Need for Tdap vaccination 06/01/2023   Encounter for screening mammogram for breast cancer 06/01/2023   Hot flashes 06/01/2023   Mass of right knee 06/01/2023   Comedone 06/01/2023   Need for shingles vaccine 10/30/2022   Influenza vaccination declined 10/30/2022   Essential hypertension 04/29/2022   Mixed  hyperlipidemia 04/29/2022   Abnormal glucose 04/29/2022   Vitamin D  deficiency 04/29/2022   Dysphagia 04/24/2022   Feces contents abnormal 04/24/2022   Flatulence, eructation and gas pain 04/24/2022   Gastroesophageal reflux disease 04/24/2022   Other fatigue 04/24/2022   Nausea 04/24/2022   Epigastric pain 04/24/2022   Right upper quadrant pain 04/24/2022   Snoring 04/24/2022   Tobacco abuse counseling 04/24/2022   Lung nodule 11/17/2018   ANA positive 10/23/2016   Multiple joint pain 10/23/2016   History of headache 10/23/2016   Tobacco use 10/23/2016   History of gastroesophageal reflux (GERD) 10/23/2016   History of cholecystectomy 10/23/2016   Hypertrophic scar 08/13/2011   Verruca vulgaris 08/13/2011   Past Medical History:  Diagnosis Date   Abdominal bloating 04/24/2022   Abnormal weight gain 04/24/2022   Acid reflux    Arthritis    Bell's palsy 07/2020   Headache(784.0)    Hyperlipidemia    Symptomatic cholelithiasis 07/20/2012   Vitamin D  deficiency disease     Family History  Problem Relation Age of Onset   Diabetes Mother    Bell's palsy Neg Hx     Past Surgical History:  Procedure Laterality Date   BREAST BIOPSY Right 2017   CHOLECYSTECTOMY N/A 08/12/2012   Procedure: LAPAROSCOPIC CHOLECYSTECTOMY POSSIBLE IOC;  Surgeon: Rogena Class, MD;  Location: [facility];  Service: General;  Laterality: N/A;   TONSILLECTOMY     Social History   Occupational History    Comment: [employer]  Tobacco Use   Smoking status: Every Day    Current packs/day: 0.25    Average packs/day: 0.3 packs/day for 20.0 years (5.0 ttl pk-yrs)    Types: Cigarettes   Smokeless tobacco: Never   Tobacco comments:    she is smoking 6 - 7 cigarettes a day, 11/29 - smoking 5 cigs a day  Substance and Sexual Activity   Alcohol use: Yes    Comment: occ   Drug use: No   Sexual activity: Yes    Birth control/protection: I.U.D.

## 2023-06-23 ENCOUNTER — Ambulatory Visit
Admission: RE | Admit: 2023-06-23 | Discharge: 2023-06-23 | Disposition: A | Discharge status: Home / Self Care | Source: Ambulatory Visit | Attending: Nurse Practitioner | Admitting: Nurse Practitioner

## 2023-06-23 DIAGNOSIS — R921 Mammographic calcification found on diagnostic imaging of breast: Secondary | ICD-10-CM

## 2023-07-16 ENCOUNTER — Other Ambulatory Visit: Payer: Self-pay | Admitting: Nurse Practitioner

## 2023-07-16 DIAGNOSIS — Z720 Tobacco use: Secondary | ICD-10-CM

## 2023-07-22 ENCOUNTER — Encounter: Payer: Self-pay | Admitting: Dermatology

## 2023-07-22 ENCOUNTER — Ambulatory Visit: Admitting: Dermatology

## 2023-07-22 VITALS — BP 123/81 | HR 73

## 2023-07-22 DIAGNOSIS — L720 Epidermal cyst: Secondary | ICD-10-CM | POA: Diagnosis not present

## 2023-07-22 DIAGNOSIS — L723 Sebaceous cyst: Secondary | ICD-10-CM

## 2023-07-22 MED ORDER — TRIAMCINOLONE ACETONIDE 10 MG/ML IJ SUSP
10.0000 mg | Freq: Once | INTRAMUSCULAR | Status: AC
  Administered 2023-07-22: 10 mg

## 2023-07-22 NOTE — Progress Notes (Unsigned)
   New Patient Visit   Subjective  Jacqueline Baxter is a 58 y.o. female who presents for the following: concerned about a spot  The following portions of the chart were reviewed this encounter and updated as appropriate: medications, allergies, medical history  Review of Systems:  No other skin or systemic complaints except as noted in HPI or Assessment and Plan.  Objective  Well appearing patient in no apparent distress; mood and affect are within normal limits.   A focused examination was performed of the following areas: chest  Relevant exam findings are noted in the Assessment and Plan.  Right Breast   Assessment & Plan   EPIDERMAL INCLUSION CYST Exam: 8mm Subcutaneous nodule at upper right breast  Benign-appearing. Exam most consistent with an epidermal inclusion cyst. Discussed that a cyst is a benign growth that can grow over time and sometimes get irritated or inflamed. Recommend observation if it is not bothersome. Discussed option of surgical excision to remove it if it is growing, symptomatic, or other changes noted. Please call for new or changing lesions so they can be evaluated.  INFLAMED SEBACEOUS CYST Right Breast Intralesional injection - Right Breast Procedure Note Intralesional Injection  Location: right breast   Informed Consent: Discussed risks (infection, pain, bleeding, bruising, thinning of the skin, loss of skin pigment, lack of resolution, and recurrence of lesion) and benefits of the procedure, as well as the alternatives. Informed consent was obtained. Preparation: The area was prepared a standard fashion.   Procedure Details: An intralesional injection was performed with Kenalog 10 mg/cc. 0.5 cc in total were injected. NDC #: 6644-0347-42 Exp: mar 2026 Lot: [id number]  Total number of injections: 1  Plan: The patient was instructed on post-op care. Recommend OTC analgesia as needed for pain.   Incision and Drainage - Right Breast Related  Medications triamcinolone acetonide (KENALOG) 10 MG/ML injection 10 mg   Return if symptoms worsen or fail to improve.  IHaig Levan, Surg Tech III, am acting as scribe for [employer], DO.   Documentation: I have reviewed the above documentation for accuracy and completeness, and I agree with the above.  Louana Roup, DO

## 2023-07-22 NOTE — Patient Instructions (Signed)

## 2023-07-27 ENCOUNTER — Other Ambulatory Visit: Payer: Self-pay | Admitting: Nurse Practitioner

## 2023-07-27 DIAGNOSIS — I10 Essential (primary) hypertension: Secondary | ICD-10-CM

## 2023-08-16 IMAGING — MG DIGITAL DIAGNOSTIC BILAT W/ TOMO W/ CAD
6 of 10 series · 6 of 26 positions shown · non-contrast
Comparison: Previous exam(s).

CLINICAL DATA: 55-year-old female presenting for annual bilateral
mammogram and 1 year follow-up of probably benign left breast
calcifications.

EXAM:
DIGITAL DIAGNOSTIC BILATERAL MAMMOGRAM WITH TOMOSYNTHESIS AND CAD
TECHNIQUE: Bilateral digital diagnostic mammography and breast tomosynthesis
was performed. The images were evaluated with computer-aided
detection.

[L ML]
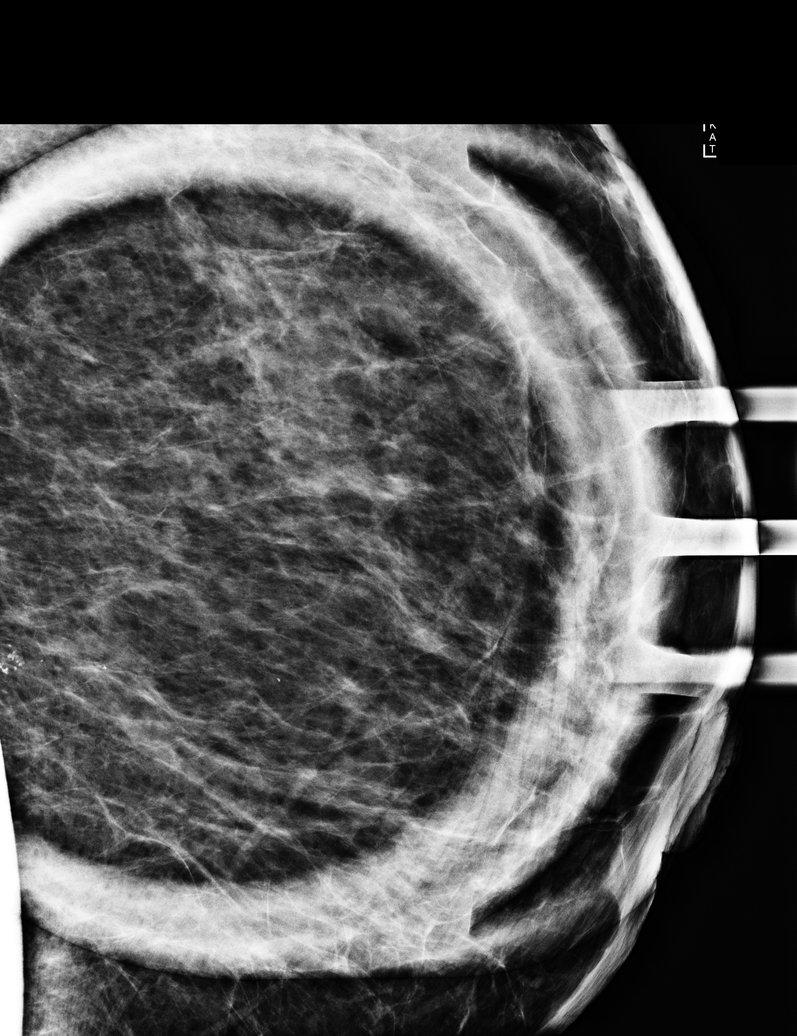

[L CC]
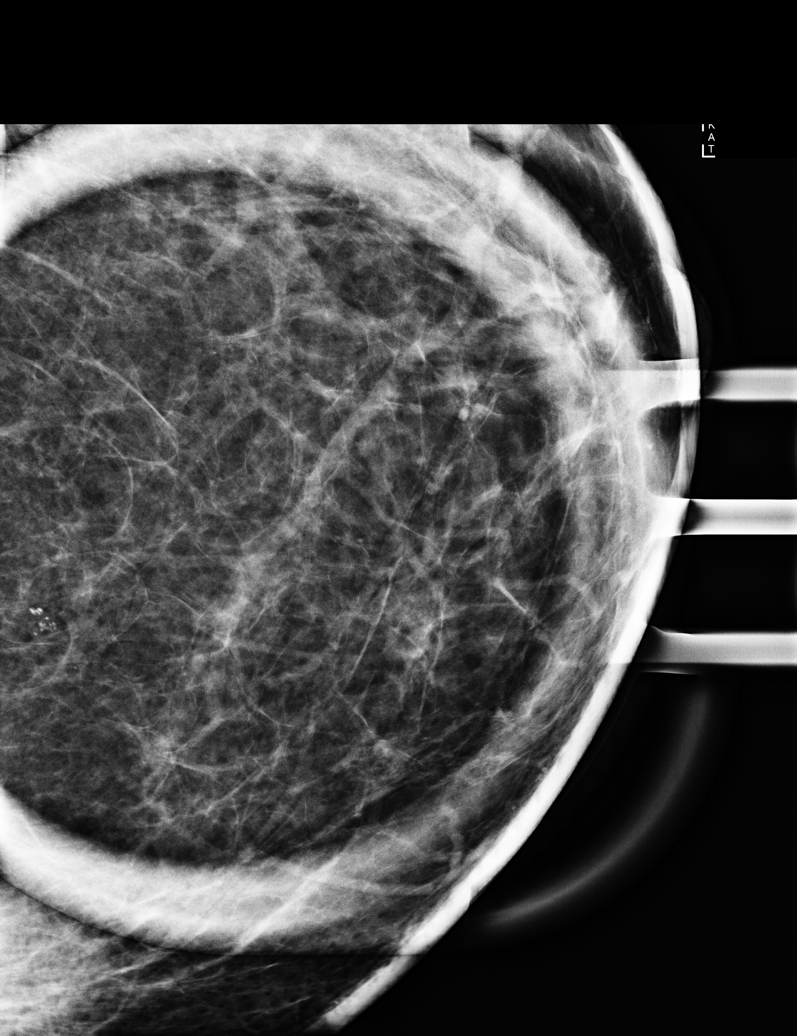

[R MLO synth-2D]
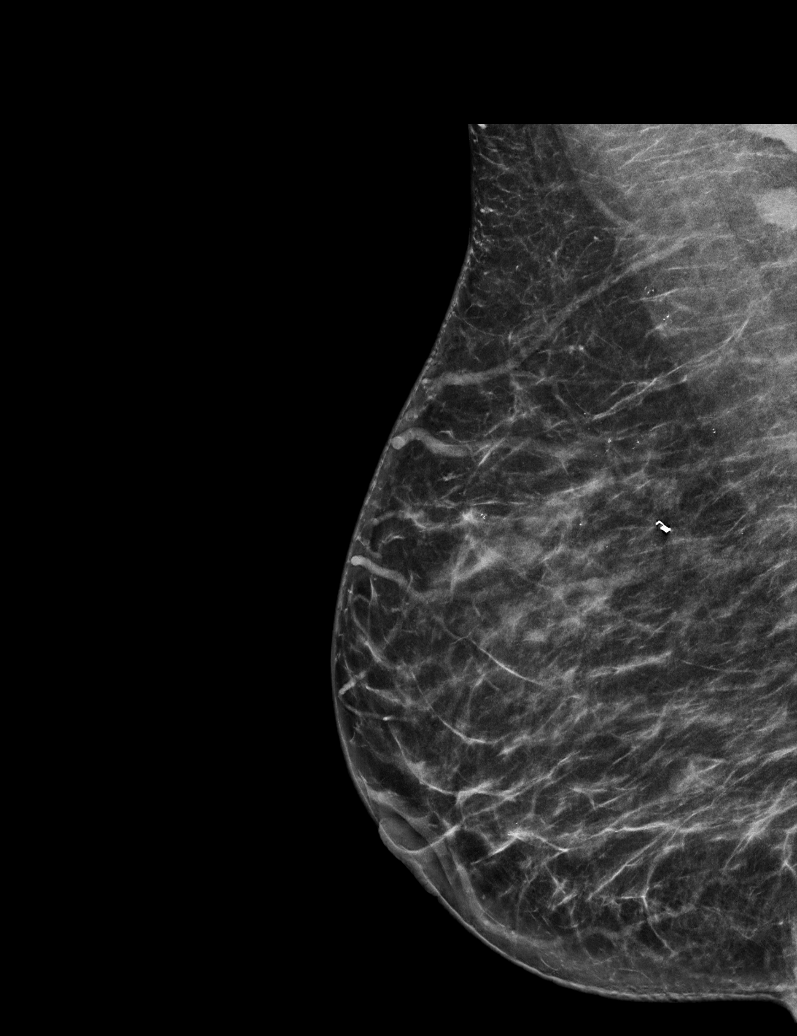

[R CC synth-2D]
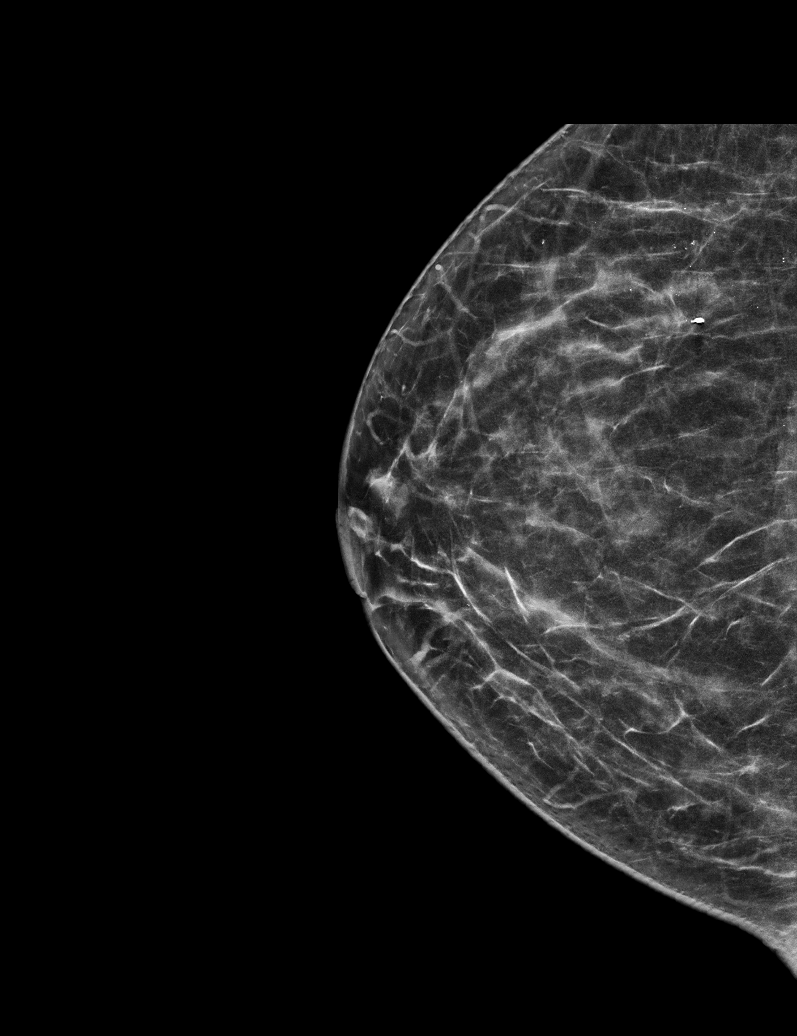

[L MLO synth-2D]
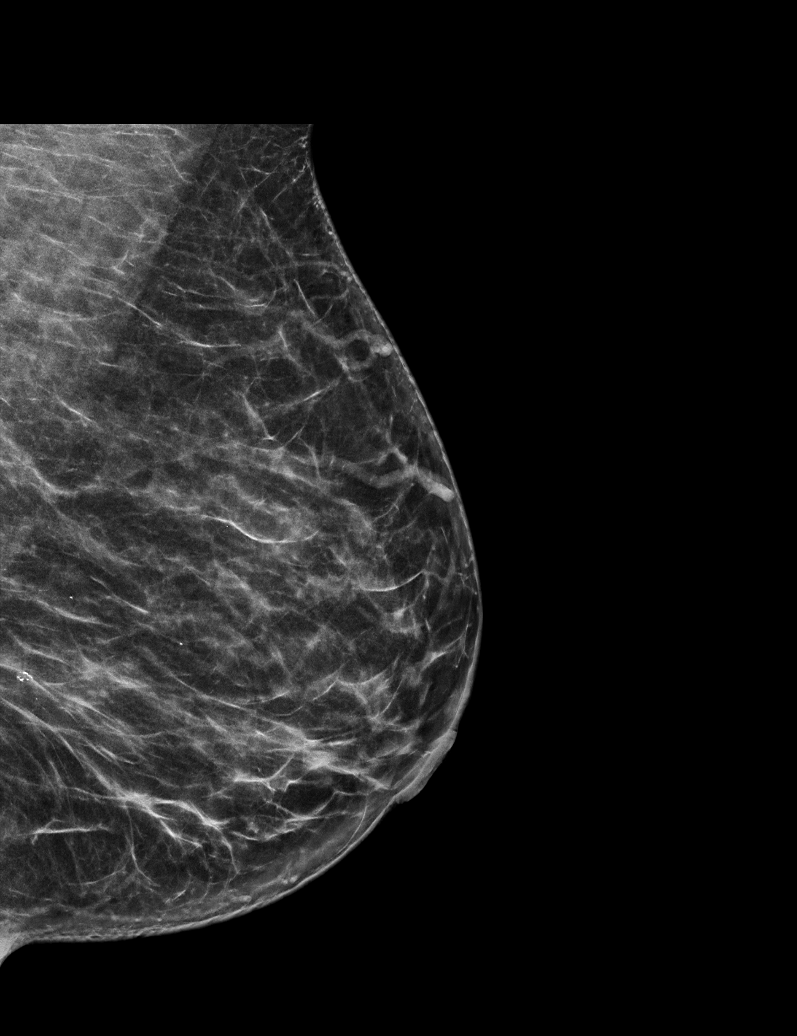

[L CC synth-2D]
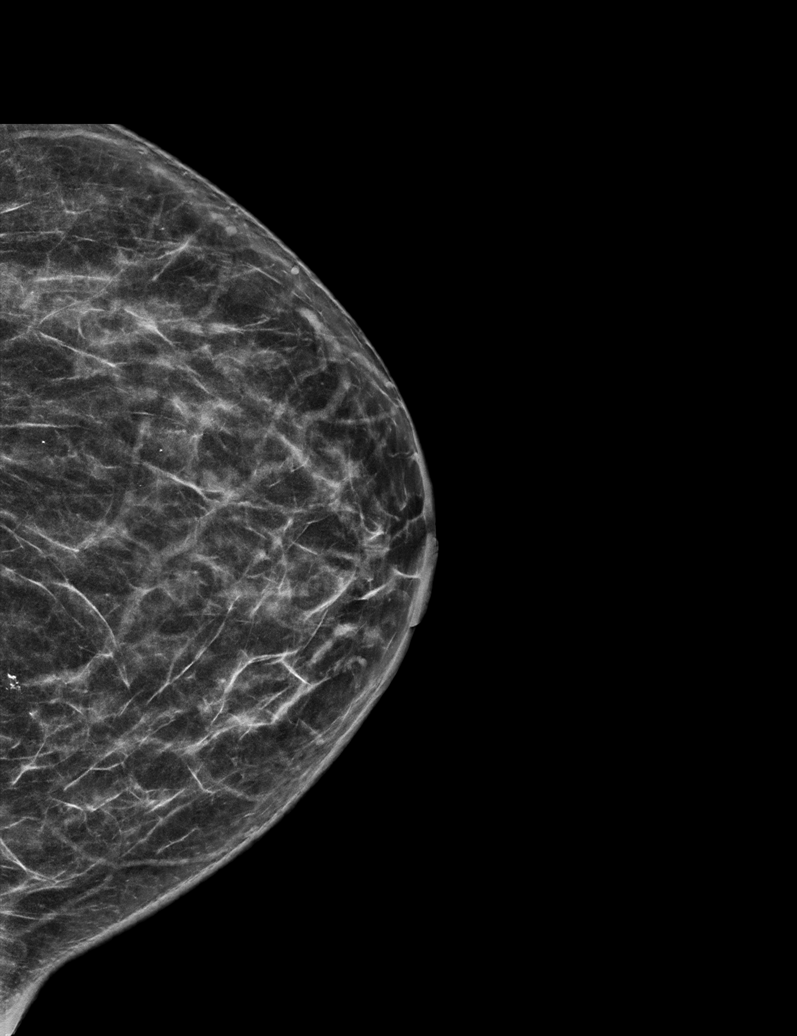

[6 of 26 positions shown; findings below may reference images not displayed]

ACR Breast Density Category c: The breast tissue is heterogeneously
dense, which may obscure small masses.
FINDINGS: Coarse/dystrophic calcifications in the far posterior upper inner
left breast are mammographically stable. Otherwise, no new or
suspicious findings in either breast. The parenchymal pattern is
stable.
IMPRESSION: 1. Stable, probably benign left breast calcifications. Recommend
continued short-term follow-up.
2. No mammographic evidence of malignancy on the right.

RECOMMENDATION:
Bilateral diagnostic mammogram in 1 year.

I have discussed the findings and recommendations with the patient.
If applicable, a reminder letter will be sent to the patient
regarding the next appointment.

BI-RADS CATEGORY  3: Probably benign.

## 2023-09-20 ENCOUNTER — Other Ambulatory Visit: Payer: Self-pay | Admitting: Nurse Practitioner

## 2023-11-04 ENCOUNTER — Other Ambulatory Visit: Payer: Self-pay | Admitting: Nurse Practitioner

## 2023-11-04 DIAGNOSIS — I10 Essential (primary) hypertension: Secondary | ICD-10-CM

## 2023-11-19 ENCOUNTER — Ambulatory Visit: Admitting: Nurse Practitioner

## 2023-12-01 ENCOUNTER — Encounter: Payer: Self-pay | Admitting: Nurse Practitioner

## 2023-12-01 ENCOUNTER — Ambulatory Visit: Admitting: Nurse Practitioner

## 2023-12-01 VITALS — BP 120/70 | HR 80 | Temp 98.8°F | Ht 61.0 in | Wt 149.0 lb

## 2023-12-01 DIAGNOSIS — R7309 Other abnormal glucose: Secondary | ICD-10-CM | POA: Diagnosis not present

## 2023-12-01 DIAGNOSIS — Z720 Tobacco use: Secondary | ICD-10-CM

## 2023-12-01 DIAGNOSIS — Z23 Encounter for immunization: Secondary | ICD-10-CM

## 2023-12-01 DIAGNOSIS — E559 Vitamin D deficiency, unspecified: Secondary | ICD-10-CM | POA: Diagnosis not present

## 2023-12-01 DIAGNOSIS — E782 Mixed hyperlipidemia: Secondary | ICD-10-CM | POA: Diagnosis not present

## 2023-12-01 DIAGNOSIS — I10 Essential (primary) hypertension: Secondary | ICD-10-CM

## 2023-12-01 DIAGNOSIS — Z139 Encounter for screening, unspecified: Secondary | ICD-10-CM

## 2023-12-01 MED ORDER — VARENICLINE TARTRATE (STARTER) 0.5 MG X 11 & 1 MG X 42 PO TBPK: ORAL_TABLET | ORAL | 0 refills | Status: AC

## 2023-12-01 MED ORDER — AMLODIPINE BESYLATE 2.5 MG PO TABS: 2.5000 mg | ORAL_TABLET | Freq: Every day | ORAL | 1 refills | Status: AC

## 2023-12-01 MED ORDER — OMEPRAZOLE 40 MG PO CPDR: 40.0000 mg | DELAYED_RELEASE_CAPSULE | Freq: Every day | ORAL | 1 refills | Status: AC

## 2023-12-01 NOTE — Progress Notes (Signed)
 LILLETTE Kristeen JINNY Gladis, CMA,acting as a neurosurgeon for Gaines Ada, FNP.,have documented all relevant documentation on the behalf of Gaines Ada, FNP,as directed by  Gaines Ada, FNP while in the presence of Gaines Ada, FNP.  Subjective:  Patient ID: Jacqueline Baxter , female    DOB: 28-Oct-1965 , 58 y.o.   MRN: [id number]  Chief Complaint  Patient presents with   Hypertension    Patient presents today for a bp and pre dm follow up, Patient reports compliance with medication. Patient denies any chest pain, SOB, or headaches. Patient has no concerns today.    HPI  Discussed the use of AI scribe software for clinical note transcription with the patient, who gave verbal consent to proceed.  History of Present Illness Jacqueline Baxter is a 58 year old female with hypertension and prediabetes who presents for a follow-up visit.  Her blood pressure was 120/70 mmHg at today's visit, compared to 123/81 mmHg in June. She has not been taking her blood pressure medication for the past two weeks due to running out and not refilling it, as she anticipated addressing this at her upcoming appointment.  She continues to take metformin  for her prediabetes and wants to discontinue it. She is actively exercising and attempting to improve her diet by reducing sweets, sugars, breads, rice, and potatoes. Her A1c is currently 6.1, down from 6.2. She is working on losing weight, aiming to lose about ten more pounds.  She mentions difficulty seeing even with her current glasses and believes it might be time for a new prescription.   Past Medical History:  Diagnosis Date   Abdominal bloating 04/24/2022   Abnormal weight gain 04/24/2022   Acid reflux    Arthritis    Bell's palsy 07/2020   Headache(784.0)    Hyperlipidemia    Symptomatic cholelithiasis 07/20/2012   Vitamin D  deficiency disease      Family History  Problem Relation Age of Onset   Diabetes Mother    Bell's palsy Neg Hx      Current Outpatient  Medications:    ALPRAZolam  (XANAX ) 0.5 MG tablet, for sedation before MRI scan; take 1 tab 1 hour before scan; may repeat 1 tab 15 min before scan, Disp: 3 tablet, Rfl: 0   cyclobenzaprine  (FLEXERIL ) 10 MG tablet, Take 1 tablet by mouth three times daily as needed for muscle spasm, Disp: 30 tablet, Rfl: 0   hydroxypropyl methylcellulose / hypromellose (ISOPTO TEARS / GONIOVISC) 2.5 % ophthalmic solution, Place 1 drop into the left eye 4 (four) times daily as needed for dry eyes., Disp: 15 mL, Rfl: 12   metFORMIN  (GLUCOPHAGE ) 500 MG tablet, Take 1 tablet by mouth once daily with breakfast, Disp: 90 tablet, Rfl: 0   Multiple Vitamin (MULTIVITAMIN WITH MINERALS) TABS, Take 1 tablet by mouth daily., Disp: , Rfl:    Probiotic Product (PROBIOTIC DAILY PO), Take 1 capsule by mouth daily at 12 noon., Disp: , Rfl:    Varenicline  Tartrate, Starter, (CHANTIX  STARTING MONTH PAK) 0.5 MG X 11 & 1 MG X 42 TBPK, Take as directed, Disp: 53 each, Rfl: 0   Vitamin D , Ergocalciferol , (DRISDOL ) 1.25 MG (50000 UNIT) CAPS capsule, Take 1 capsule (50,000 Units total) by mouth every 7 (seven) days., Disp: 12 capsule, Rfl: 1   amLODipine  (NORVASC ) 2.5 MG tablet, Take 1 tablet (2.5 mg total) by mouth daily., Disp: 90 tablet, Rfl: 1   omeprazole (PRILOSEC) 40 MG capsule, Take 1 capsule (40 mg total) by mouth daily., Disp:  90 capsule, Rfl: 1   Allergies  Allergen Reactions   Penicillins Other (See Comments)    Unknown childhood reaction,swelling     Review of Systems  Constitutional: Negative.   HENT: Negative.    Eyes: Negative.   Respiratory: Negative.    Cardiovascular: Negative.   Gastrointestinal: Negative.   Psychiatric/Behavioral: Negative.       Today's Vitals   12/01/23 1459  BP: 120/70  Pulse: 80  Temp: 98.8 F (37.1 C)  TempSrc: Oral  Weight: 149 lb (67.6 kg)  Height: 5' 1 (1.549 m)  PainSc: 0-No pain   Body mass index is 28.15 kg/m.  Wt Readings from Last 3 Encounters:  12/01/23 149 lb  (67.6 kg)  05/20/23 155 lb 3.2 oz (70.4 kg)  12/12/22 154 lb (69.9 kg)      Objective:  Physical Exam Vitals and nursing note reviewed.  Constitutional:      General: She is not in acute distress.    Appearance: Normal appearance. She is well-developed and normal weight.  HENT:     Head: Normocephalic and atraumatic.  Eyes:     Pupils: Pupils are equal, round, and reactive to light.  Cardiovascular:     Rate and Rhythm: Normal rate and regular rhythm.     Pulses: Normal pulses.     Heart sounds: Normal heart sounds. No murmur heard. Pulmonary:     Effort: Pulmonary effort is normal. No respiratory distress.     Breath sounds: Normal breath sounds. No wheezing.  Musculoskeletal:        General: No swelling. Normal range of motion.     Right lower leg: No edema.     Left lower leg: No edema.  Skin:    General: Skin is warm and dry.     Capillary Refill: Capillary refill takes less than 2 seconds.  Neurological:     General: No focal deficit present.     Mental Status: She is alert and oriented to person, place, and time.     Cranial Nerves: Facial asymmetry (due to Bell's Palsy) present. No cranial nerve deficit.     Motor: No weakness.  Psychiatric:        Mood and Affect: Mood normal.        Behavior: Behavior normal.        Thought Content: Thought content normal.        Judgment: Judgment normal.      Assessment And Plan:  Essential hypertension Assessment & Plan: Blood pressure is well-controlled at 120/70 mmHg despite not taking Amlodipine  for two weeks. - Instructed her to call for a refill if medication runs out before next appointment.  Orders: -     Lipid panel -     amLODIPine  Besylate; Take 1 tablet (2.5 mg total) by mouth daily.  Dispense: 90 tablet; Refill: 1  Abnormal glucose Assessment & Plan: A1c is 6.1%, indicating risk for diabetes if it exceeds 6.4%. - Emphasized lifestyle modifications to prevent diabetes. - Encouraged regular exercise and  dietary changes, reducing sweets, sugars, breads, rice, and potatoes. - Plan for 10-pound weight loss. - Schedule follow-up in February to reassess A1c.  Orders: -     Hemoglobin A1c -     BMP8+eGFR  Mixed hyperlipidemia Assessment & Plan: Cholesterol levels were improved at last visit however remains elevated above goal.  Continue statin, tolerating well.  Continue focusing on low-fat diet.  Orders: -     BMP8+eGFR  Need for influenza vaccination -  Flu vaccine trivalent PF, 6mos and older(Flulaval,Afluria,Fluarix,Fluzone)  Encounter for screening -     Hepatitis B surface antibody,qualitative  Tobacco use -     Varenicline  Tartrate (Starter); Take as directed  Dispense: 53 each; Refill: 0  Vitamin D  deficiency Assessment & Plan: Will check vitamin D  level and supplement as needed.    Also encouraged to spend 15 minutes in the sun daily.    Orders: -     VITAMIN D  25 Hydroxy (Vit-D Deficiency, Fractures)  Other orders -     Omeprazole; Take 1 capsule (40 mg total) by mouth daily.  Dispense: 90 capsule; Refill: 1    Return for 6 month bp check.  Patient was given opportunity to ask questions. Patient verbalized understanding of the plan and was able to repeat key elements of the plan. All questions were answered to their satisfaction.    LILLETTE Gaines Ada, FNP, have reviewed all documentation for this visit. The documentation on 12/01/23 for the exam, diagnosis, procedures, and orders are all accurate and complete.   IF YOU HAVE BEEN REFERRED TO A SPECIALIST, IT MAY TAKE 1-2 WEEKS TO SCHEDULE/PROCESS THE REFERRAL. IF YOU HAVE NOT HEARD FROM US /SPECIALIST IN TWO WEEKS, PLEASE GIVE US  A CALL AT [phone].

## 2023-12-02 ENCOUNTER — Ambulatory Visit: Admitting: Nurse Practitioner

## 2023-12-02 LAB — BMP8+EGFR
BUN/Creatinine Ratio: 16 (ref 9–23)
BUN: 12 mg/dL (ref 6–24)
CO2: 22 mmol/L (ref 20–29)
Calcium: 9.6 mg/dL (ref 8.7–10.2)
Chloride: 103 mmol/L (ref 96–106)
Creatinine, Ser: 0.76 mg/dL (ref 0.57–1.00)
Glucose: 71 mg/dL (ref 70–99)
Potassium: 4.4 mmol/L (ref 3.5–5.2)
Sodium: 141 mmol/L (ref 134–144)
eGFR: 91 mL/min/1.73 (ref >59)

## 2023-12-02 LAB — HEPATITIS B SURFACE ANTIBODY,QUALITATIVE: Hep B Surface Ab, Qual: NONREACTIVE

## 2023-12-02 LAB — LIPID PANEL
Chol/HDL Ratio: 3.2 ratio (ref 0.0–4.4)
Cholesterol, Total: 237 mg/dL — ABNORMAL HIGH (ref 100–199)
HDL: 75 mg/dL (ref >39)
LDL Chol Calc (NIH): 122 mg/dL — ABNORMAL HIGH (ref 0–99)
Triglycerides: 232 mg/dL — ABNORMAL HIGH (ref 0–149)
VLDL Cholesterol Cal: 40 mg/dL (ref 5–40)

## 2023-12-02 LAB — VITAMIN D 25 HYDROXY (VIT D DEFICIENCY, FRACTURES): Vit D, 25-Hydroxy: 21.3 ng/mL — ABNORMAL LOW (ref 30.0–100.0)

## 2023-12-02 LAB — HEMOGLOBIN A1C
Est. average glucose Bld gHb Est-mCnc: 120 mg/dL
Hgb A1c MFr Bld: 5.8 % — ABNORMAL HIGH (ref 4.8–5.6)

## 2023-12-13 ENCOUNTER — Ambulatory Visit: Payer: Self-pay | Admitting: Nurse Practitioner

## 2023-12-13 NOTE — Assessment & Plan Note (Signed)
 Will check vitamin D  level and supplement as needed.    Also encouraged to spend 15 minutes in the sun daily.

## 2023-12-13 NOTE — Assessment & Plan Note (Signed)
 A1c is 6.1%, indicating risk for diabetes if it exceeds 6.4%. - Emphasized lifestyle modifications to prevent diabetes. - Encouraged regular exercise and dietary changes, reducing sweets, sugars, breads, rice, and potatoes. - Plan for 10-pound weight loss. - Schedule follow-up in February to reassess A1c.

## 2023-12-13 NOTE — Assessment & Plan Note (Signed)
Cholesterol levels were improved at last visit however remains elevated above goal.  Continue statin, tolerating well.  Continue focusing on low-fat diet.

## 2023-12-13 NOTE — Assessment & Plan Note (Signed)
 Blood pressure is well-controlled at 120/70 mmHg despite not taking Amlodipine  for two weeks. - Instructed her to call for a refill if medication runs out before next appointment.

## 2023-12-18 ENCOUNTER — Other Ambulatory Visit: Payer: Self-pay | Admitting: Medical Genetics

## 2023-12-21 ENCOUNTER — Encounter: Payer: Self-pay | Admitting: Radiology

## 2023-12-25 ENCOUNTER — Other Ambulatory Visit: Payer: Self-pay | Admitting: Nurse Practitioner

## 2024-02-03 ENCOUNTER — Other Ambulatory Visit: Payer: Self-pay

## 2024-02-03 DIAGNOSIS — Z006 Encounter for examination for normal comparison and control in clinical research program: Secondary | ICD-10-CM

## 2024-02-22 LAB — GENECONNECT MOLECULAR SCREEN: Genetic Analysis Overall Interpretation: NEGATIVE

## 2024-03-15 ENCOUNTER — Ambulatory Visit: Admitting: Dermatology

## 2024-03-28 ENCOUNTER — Ambulatory Visit: Admitting: Dermatology

## 2024-07-19 ENCOUNTER — Encounter: Payer: Self-pay | Admitting: Nurse Practitioner
